# Patient Record
Sex: Male | Born: 1935 | Race: Black or African American | Hispanic: No | Marital: Married | Smoking: Current every day smoker
Health system: Southern US, Community
[De-identification: ages and names within clinical notes are randomized; demographics above are authoritative.]

## PROBLEM LIST (undated history)

## (undated) DIAGNOSIS — N400 Enlarged prostate without lower urinary tract symptoms: Secondary | ICD-10-CM

## (undated) DIAGNOSIS — C679 Malignant neoplasm of bladder, unspecified: Secondary | ICD-10-CM

## (undated) DIAGNOSIS — M109 Gout, unspecified: Secondary | ICD-10-CM

## (undated) DIAGNOSIS — IMO0001 Reserved for inherently not codable concepts without codable children: Secondary | ICD-10-CM

## (undated) DIAGNOSIS — I10 Essential (primary) hypertension: Secondary | ICD-10-CM

## (undated) DIAGNOSIS — J449 Chronic obstructive pulmonary disease, unspecified: Secondary | ICD-10-CM

## (undated) DIAGNOSIS — M1990 Unspecified osteoarthritis, unspecified site: Secondary | ICD-10-CM

## (undated) DIAGNOSIS — I739 Peripheral vascular disease, unspecified: Secondary | ICD-10-CM

## (undated) HISTORY — DX: Essential (primary) hypertension: I10

## (undated) HISTORY — DX: Gout, unspecified: M10.9

## (undated) HISTORY — PX: CATARACT EXTRACTION W/ INTRAOCULAR LENS  IMPLANT, BILATERAL: SHX1307

## (undated) HISTORY — DX: Chronic obstructive pulmonary disease, unspecified: J44.9

---

## 1976-03-21 HISTORY — PX: HAND SURGERY: SHX662

## 2006-03-07 ENCOUNTER — Emergency Department: Payer: Self-pay | Admitting: Emergency Medicine

## 2007-03-27 ENCOUNTER — Ambulatory Visit: Payer: Self-pay | Admitting: Ophthalmology

## 2007-03-27 ENCOUNTER — Other Ambulatory Visit: Payer: Self-pay

## 2007-04-09 ENCOUNTER — Ambulatory Visit: Payer: Self-pay | Admitting: Ophthalmology

## 2010-11-11 ENCOUNTER — Emergency Department: Payer: Self-pay | Admitting: Internal Medicine

## 2012-12-07 ENCOUNTER — Other Ambulatory Visit: Payer: Self-pay | Admitting: Ophthalmology

## 2012-12-11 LAB — WOUND AEROBIC CULTURE

## 2012-12-28 LAB — CULTURE, FUNGUS WITHOUT SMEAR

## 2013-01-09 ENCOUNTER — Ambulatory Visit: Payer: Self-pay | Admitting: Ophthalmology

## 2013-03-18 ENCOUNTER — Inpatient Hospital Stay: Payer: Self-pay | Admitting: Internal Medicine

## 2013-03-18 LAB — RAPID INFLUENZA A&B ANTIGENS

## 2013-03-18 LAB — PRO B NATRIURETIC PEPTIDE: B-Type Natriuretic Peptide: 870 pg/mL — ABNORMAL HIGH (ref 0–450)

## 2013-03-18 LAB — TROPONIN I: Troponin-I: 0.42 ng/mL — ABNORMAL HIGH

## 2013-03-18 LAB — COMPREHENSIVE METABOLIC PANEL
Anion Gap: 11 (ref 7–16)
BUN: 24 mg/dL — ABNORMAL HIGH (ref 7–18)
Bilirubin,Total: 0.9 mg/dL (ref 0.2–1.0)
Calcium, Total: 9.3 mg/dL (ref 8.5–10.1)
Chloride: 98 mmol/L (ref 98–107)
Co2: 21 mmol/L (ref 21–32)
Creatinine: 1.53 mg/dL — ABNORMAL HIGH (ref 0.60–1.30)
Glucose: 152 mg/dL — ABNORMAL HIGH (ref 65–99)
Osmolality: 268 (ref 275–301)
SGOT(AST): 48 U/L — ABNORMAL HIGH (ref 15–37)
Total Protein: 9.8 g/dL — ABNORMAL HIGH (ref 6.4–8.2)

## 2013-03-18 LAB — CBC WITH DIFFERENTIAL/PLATELET
Basophil #: 0 10*3/uL (ref 0.0–0.1)
Eosinophil #: 0 10*3/uL (ref 0.0–0.7)
Eosinophil %: 0.1 %
HGB: 11.3 g/dL — ABNORMAL LOW (ref 13.0–18.0)
Lymphocyte %: 13.1 %
MCH: 29.3 pg (ref 26.0–34.0)
MCHC: 33.8 g/dL (ref 32.0–36.0)
MCV: 87 fL (ref 80–100)
Monocyte #: 0.3 x10 3/mm (ref 0.2–1.0)
Neutrophil %: 80.6 %
Platelet: 250 10*3/uL (ref 150–440)
RBC: 3.84 10*6/uL — ABNORMAL LOW (ref 4.40–5.90)
RDW: 18.5 % — ABNORMAL HIGH (ref 11.5–14.5)
WBC: 5.6 10*3/uL (ref 3.8–10.6)

## 2013-03-18 LAB — PROTIME-INR: INR: 1.1

## 2013-03-19 LAB — URINALYSIS, COMPLETE
Bacteria: NONE SEEN
Nitrite: NEGATIVE
RBC,UR: 1 /HPF (ref 0–5)
Squamous Epithelial: NONE SEEN
WBC UR: 1 /HPF (ref 0–5)

## 2013-03-19 LAB — BASIC METABOLIC PANEL
Chloride: 101 mmol/L (ref 98–107)
Co2: 22 mmol/L (ref 21–32)
Creatinine: 1.3 mg/dL (ref 0.60–1.30)
EGFR (African American): >60
EGFR (Non-African Amer.): 52 — ABNORMAL LOW
Osmolality: 286 (ref 275–301)
Sodium: 136 mmol/L (ref 136–145)

## 2013-03-19 LAB — MAGNESIUM: Magnesium: 2.2 mg/dL

## 2013-03-19 LAB — CK-MB: CK-MB: 6.3 ng/mL — ABNORMAL HIGH (ref 0.5–3.6)

## 2013-03-20 ENCOUNTER — Ambulatory Visit: Payer: Self-pay | Admitting: Hematology and Oncology

## 2013-03-23 LAB — CULTURE, BLOOD (SINGLE)

## 2013-04-29 ENCOUNTER — Ambulatory Visit: Payer: Self-pay | Admitting: Hematology and Oncology

## 2013-05-13 ENCOUNTER — Ambulatory Visit: Payer: Self-pay | Admitting: Hematology and Oncology

## 2013-05-19 ENCOUNTER — Ambulatory Visit: Payer: Self-pay | Admitting: Hematology and Oncology

## 2013-07-25 DIAGNOSIS — I10 Essential (primary) hypertension: Secondary | ICD-10-CM | POA: Insufficient documentation

## 2013-07-25 DIAGNOSIS — M109 Gout, unspecified: Secondary | ICD-10-CM | POA: Insufficient documentation

## 2013-07-25 DIAGNOSIS — R918 Other nonspecific abnormal finding of lung field: Secondary | ICD-10-CM | POA: Insufficient documentation

## 2013-07-25 DIAGNOSIS — J449 Chronic obstructive pulmonary disease, unspecified: Secondary | ICD-10-CM | POA: Insufficient documentation

## 2013-07-25 DIAGNOSIS — H2010 Chronic iridocyclitis, unspecified eye: Secondary | ICD-10-CM | POA: Insufficient documentation

## 2013-07-25 DIAGNOSIS — F17210 Nicotine dependence, cigarettes, uncomplicated: Secondary | ICD-10-CM | POA: Insufficient documentation

## 2013-07-29 ENCOUNTER — Other Ambulatory Visit: Payer: Self-pay | Admitting: Rheumatology

## 2013-07-29 LAB — SYNOVIAL CELL COUNT + DIFF, W/ CRYSTALS
BASOS ABS: 0 %
Eosinophil: 0 %
Lymphocytes: 1 %
Neutrophils: 89 %
Nucleated Cell Count: 10653 /mm3
OTHER CELLS BF: 0 %
Other Mononuclear Cells: 10 %

## 2013-09-13 ENCOUNTER — Ambulatory Visit: Payer: Self-pay | Admitting: Hematology and Oncology

## 2013-09-13 LAB — COMPREHENSIVE METABOLIC PANEL
Albumin: 3.1 g/dL — ABNORMAL LOW (ref 3.4–5.0)
Alkaline Phosphatase: 113 U/L
Anion Gap: 9 (ref 7–16)
BUN: 11 mg/dL (ref 7–18)
Bilirubin,Total: 0.4 mg/dL (ref 0.2–1.0)
Calcium, Total: 9.7 mg/dL (ref 8.5–10.1)
Chloride: 101 mmol/L (ref 98–107)
Co2: 29 mmol/L (ref 21–32)
Creatinine: 0.86 mg/dL (ref 0.60–1.30)
EGFR (African American): >60
EGFR (Non-African Amer.): >60
GLUCOSE: 93 mg/dL (ref 65–99)
Osmolality: 277 (ref 275–301)
Potassium: 3.9 mmol/L (ref 3.5–5.1)
SGOT(AST): 11 U/L — ABNORMAL LOW (ref 15–37)
SGPT (ALT): 11 U/L — ABNORMAL LOW (ref 12–78)
Sodium: 139 mmol/L (ref 136–145)
Total Protein: 9.2 g/dL — ABNORMAL HIGH (ref 6.4–8.2)

## 2013-09-13 LAB — CBC CANCER CENTER
BASOS ABS: 0 x10 3/mm (ref 0.0–0.1)
BASOS PCT: 0.1 %
EOS ABS: 0 x10 3/mm (ref 0.0–0.7)
EOS PCT: 0.6 %
HCT: 35.8 % — ABNORMAL LOW (ref 40.0–52.0)
HGB: 11.7 g/dL — ABNORMAL LOW (ref 13.0–18.0)
LYMPHS PCT: 30.3 %
Lymphocyte #: 2 x10 3/mm (ref 1.0–3.6)
MCH: 29.9 pg (ref 26.0–34.0)
MCHC: 32.7 g/dL (ref 32.0–36.0)
MCV: 92 fL (ref 80–100)
Monocyte #: 0.3 x10 3/mm (ref 0.2–1.0)
Monocyte %: 5.1 %
Neutrophil #: 4.2 x10 3/mm (ref 1.4–6.5)
Neutrophil %: 63.9 %
PLATELETS: 348 x10 3/mm (ref 150–440)
RBC: 3.91 10*6/uL — AB (ref 4.40–5.90)
RDW: 21.4 % — ABNORMAL HIGH (ref 11.5–14.5)
WBC: 6.5 x10 3/mm (ref 3.8–10.6)

## 2013-09-18 ENCOUNTER — Ambulatory Visit: Payer: Self-pay | Admitting: Hematology and Oncology

## 2014-01-09 ENCOUNTER — Ambulatory Visit: Payer: Self-pay | Admitting: Rheumatology

## 2014-01-09 LAB — BODY FLUID CELL COUNT WITH DIFFERENTIAL
BASOS ABS: 0 %
EOS PCT: 0 %
LYMPHS PCT: 4 %
NUCLEATED CELL COUNT: 1954 /mm3
Neutrophils: 80 %
Other Cells BF: 0 %
Other Mononuclear Cells: 16 %

## 2014-01-09 LAB — SYNOVIAL FLUID, CRYSTAL

## 2014-03-18 ENCOUNTER — Ambulatory Visit: Payer: Self-pay | Admitting: Vascular Surgery

## 2014-03-31 ENCOUNTER — Ambulatory Visit: Payer: Self-pay | Admitting: Hematology and Oncology

## 2014-03-31 LAB — COMPREHENSIVE METABOLIC PANEL
ALK PHOS: 107 U/L
ALT: 9 U/L — AB
AST: 12 U/L — AB (ref 15–37)
Albumin: 3.1 g/dL — ABNORMAL LOW (ref 3.4–5.0)
Anion Gap: 7 (ref 7–16)
BUN: 14 mg/dL (ref 7–18)
Bilirubin,Total: 0.4 mg/dL (ref 0.2–1.0)
CHLORIDE: 102 mmol/L (ref 98–107)
Calcium, Total: 9.5 mg/dL (ref 8.5–10.1)
Co2: 30 mmol/L (ref 21–32)
Creatinine: 1.05 mg/dL (ref 0.60–1.30)
EGFR (African American): >60
Glucose: 98 mg/dL (ref 65–99)
Osmolality: 278 (ref 275–301)
Potassium: 4.1 mmol/L (ref 3.5–5.1)
Sodium: 139 mmol/L (ref 136–145)
TOTAL PROTEIN: 8.8 g/dL — AB (ref 6.4–8.2)

## 2014-03-31 LAB — CBC WITH DIFFERENTIAL/PLATELET
Basophil #: 0 10*3/uL (ref 0.0–0.1)
Basophil %: 0.3 %
Eosinophil #: 0.1 10*3/uL (ref 0.0–0.7)
Eosinophil %: 1.1 %
HCT: 37.1 % — AB (ref 40.0–52.0)
HGB: 12 g/dL — ABNORMAL LOW (ref 13.0–18.0)
LYMPHS PCT: 31.4 %
Lymphocyte #: 1.6 10*3/uL (ref 1.0–3.6)
MCH: 30.4 pg (ref 26.0–34.0)
MCHC: 32.4 g/dL (ref 32.0–36.0)
MCV: 94 fL (ref 80–100)
Monocyte #: 0.3 x10 3/mm (ref 0.2–1.0)
Monocyte %: 6.7 %
NEUTROS PCT: 60.5 %
Neutrophil #: 3.1 10*3/uL (ref 1.4–6.5)
Platelet: 282 10*3/uL (ref 150–440)
RBC: 3.97 10*6/uL — ABNORMAL LOW (ref 4.40–5.90)
RDW: 18.9 % — AB (ref 11.5–14.5)
WBC: 5.2 10*3/uL (ref 3.8–10.6)

## 2014-03-31 LAB — URIC ACID: Uric Acid: 4.1 mg/dL (ref 3.5–7.2)

## 2014-04-09 DIAGNOSIS — Z8679 Personal history of other diseases of the circulatory system: Secondary | ICD-10-CM | POA: Insufficient documentation

## 2014-04-21 ENCOUNTER — Ambulatory Visit: Payer: Self-pay | Admitting: Hematology and Oncology

## 2014-07-12 NOTE — Discharge Summary (Signed)
PATIENT NAME:  Nathaniel Armstrong, Nathaniel Armstrong MR#:  [id number] DATE OF BIRTH:  January 17, 1933  DATE OF ADMISSION:  03/18/2013 DATE OF DISCHARGE:  03/20/2013  ADMISSION DIAGNOSIS: Bilateral community-acquired pneumonia.   DISCHARGE DIAGNOSES: 1.  Bilateral community-acquired pneumonia.  2.  Acute renal failure.  3.  Elevated troponin from demand ischemia.  4.  History of hypertension.  5.  Hyponatremia.  6.  Hypomagnesemia.   7.  Abnormal CT scan.  8.  Tobacco abuse.   CONSULTATIONS: None.   LABORATORIES AT DISCHARGE: Urine culture mixed bacterial contamination. Blood culture negative to date. Troponin at discharge 0.38, troponin max 0.43. CT angiogram was negative for PE; however, it does show ill-defined bilateral lower lobe airspace opacities, right greater than left, worrisome for multifocal infection and indeterminate approximately 4 mm noncalcified subpleural nodule within the left upper lobe.   HOSPITAL COURSE:  This is a 81 year old male who presented with shortness of breath, was found to have bilateral pneumonia. For further details, please refer to the H and P.  1.  Bilateral community-acquired pneumonia. The patient was started on Levaquin. He also was started on oxygen as well as steroids. His blood cultures and influenza were negative. However, his wife is also sick in the hospital with possible influenza so Tamiflu was also started. He had some mild very acute COPD exacerbation secondary to his community-acquired pneumonia. He does need oxygen at discharge for his chronic respiratory failure with COPD and this new pneumonia. His CT scan on admission did not show pulmonary emboli. It was concerning for a pulmonary nodule. He has history of smoking and weight loss. I have recommended that the patient see oncology and we have scheduled an appointment with oncology as an outpatient prior to discharge. I have asked the unit clerk to schedule this.  I have also talked to the family in great detail about this  abnormal CT scan and close monitoring.  2.  Acute renal failure, resolved.  3.  Elevated troponin from demand ischemia. No evidence of acute coronary syndrome. This is secondary to supply/demand ischemia.  4.  History of hypertension. The patient's blood pressure was low-normal so we held his blood pressure medications.  5.  Hyponatremia, improved with IV fluids.  6.  Hypomagnesemia which was repleted.  7.  Abnormal CT scan with tobacco abuse. The patient was counseled to stop smoking and also as dictated on his abnormal CT he will need oncology followup.   DISCHARGE MEDICATIONS: 1.  Colcrys 0.6 mg daily.  2.  Vitamin D 2000 international units daily.  3.  Levaquin 750 mg x 7 days daily.  4.  Tamiflu 75 mg q.12h. x 4 days.  5.  Prednisone taper starting at 50 mg, taper by 10 mg every 2 days.  6.  Advair Diskus 100/50 b.i.d.  6.  The patient's Norvasc is being held due to low-normal blood pressure.   The patient is being discharged with home health.   DISCHARGE OXYGEN: 2 liters nasal cannula.   DISCHARGE FOLLOWUP: One week with [facility] and 1 week with oncology.   TIME SPENT: 35 minutes.   The patient is medically stable for discharge.   ____________________________ Elga Santy P. Benjie Karvonen, MD spm:cs D: 03/20/2013 15:58:08 ET T: 03/20/2013 19:28:56 ET JOB#: [id number]  cc: Monifah Freehling P. Benjie Karvonen, MD, <Dictator> [facility] oncology Mirna Sutcliffe P Davone Shinault MD ELECTRONICALLY SIGNED 03/21/2013 13:55

## 2014-07-12 NOTE — H&P (Signed)
PATIENT NAME:  Nathaniel Armstrong, Nathaniel Armstrong MR#:  [id number] DATE OF BIRTH:  February 18, 1933  DATE OF ADMISSION:  03/18/2013  REFERRING PHYSICIAN:  Dr. Delman Kitten   PRIMARY CARE PHYSICIAN: [facility].   CHIEF COMPLAINT: Shortness of breath, cough.   HISTORY OF PRESENT ILLNESS: This is a 81 year old male with past medical history of hypertension, gout, presents with complaints of shortness of breath and cough. For the last few days, reports his symptoms started with mild shortness of breath, mainly upon exertion seven days ago.  Then he reports that he developed cough with green productive sputum, having some chills at home, he denies any recent travel, any leg swelling. The patient was hypoxic 75% on room air. The patient reports he smokes 1 pack per day. The patient had CT chest with IV contrast out of concern of PE. The patient's CT chest came back negative for PE but did show ill-defined bilateral lower airspace opacities as well as severe emphysema and indeterminate approximately 4 mm noncalcified subpleural nodule. The patient was initially tachycardic upon presentation with a heart rate in the 130s. The patient has multiple lab abnormalities including acute renal failure with a creatinine 1.53, hypokalemia and hyponatremia and hypomagnesemia  as well. The patient's troponin came back elevated at 0.42. The patient denies any chest pain. He received 324 of aspirin in the ED.  His EKG did not show any significant ST or T wave abnormalities. As well, the patient denies any previous diagnosis of chronic obstructive pulmonary disease. Hospitalist service was requested to admit the patient for further management and treatment of his pneumonia and acute respiratory failure.   PAST MEDICAL HISTORY: 1.  Tobacco abuse.  2.  Hypertension.  3.  Gout.   PAST SURGICAL HISTORY:  Cataract surgery.   ALLERGIES: PENICILLIN.   FAMILY HISTORY: Significant for hypertension.   SOCIAL HISTORY: The patient lives at home, smokes 1  pack per day. No alcohol. No illicit drug use.   HOME MEDICATIONS: 1.  Norvasc 10 mg oral daily.  2.  Colchicine 0.6 mg oral daily.  3.  Vitamin D3 1000 international units oral daily.   REVIEW OF SYSTEMS: CONSTITUTIONAL: The patient complains of chills. Denies fever. Complains of fatigue and weakness. Denies weight gain, weight loss.  EYES: Denies blurry vision, double vision, inflammation, glaucoma.  ENT: Denies tinnitus, ear pain, hearing loss, epistaxis.  RESPIRATORY: Complains of cough and shortness of breath. Denies any wheezing, hemoptysis or chronic obstructive pulmonary disease.  CARDIOVASCULAR: Denies any chest pain, edema, arrhythmia, palpitations, syncope.  GASTROINTESTINAL: Denies nausea, vomiting, diarrhea, abdominal pain.  GENITOURINARY: Denies dysuria, hematuria, renal colic.  ENDOCRINE: Denies polyuria, polydipsia, heat or cold intolerance.  HEMATOLOGY: Denies anemia, easy bruising, bleeding diathesis.  INTEGUMENTARY: Denies acne, rash or skin lesion.  MUSCULOSKELETAL: Denies any swelling, arthritis or cramps. Reports history of gout.  NEUROLOGIC: Denies CVA, transient ischemic attack, headache, ataxia, vertigo.  PSYCHIATRIC: Denies anxiety, insomnia, bipolar disorder.   PHYSICAL EXAMINATION: VITAL SIGNS: Temperature 98.1, pulse 130, respiratory rate 26, blood pressure 151/74, saturating 74% on room air.  GENERAL: Elderly male lies comfortable in bed, in no apparent distress.  HEENT: Head is atraumatic, normocephalic. Pupils equal, reactive to light. Pink conjunctivae. Anicteric sclerae. Dry oral mucosa.  NECK: Supple. No thyromegaly. No JVD.  CHEST: Had fair air entry bilaterally. No use of accessory muscle. No wheezing, rales or rhonchi.  CARDIOVASCULAR: S1, S2 heard. No rubs, murmurs or gallops. Tachycardic.  ABDOMEN: Soft, nontender, nondistended. Bowel sounds present.  EXTREMITIES: No edema. No  clubbing. No cyanosis.  PSYCHIATRIC: Appropriate affect. Awake, alert  x 3. Normal judgement and insight.   NEUROLOGIC: Cranial nerves grossly intact. Motor strength 5/5. No focal deficits.  LYMPHATIC: No cervical lymphadenopathy.  SKIN: Warm and dry, normal skin turgor.   PERTINENT LABORATORY, DIAGNOSTIC AND RADIOLOGIC DATA: Glucose 152. BNP 870, BUN 24, creatinine 1.53, sodium 130, potassium 3.4, chloride 98, CO2 21, magnesium 1.5, ALT 20, AST 48, alkaline phosphatase 94. Troponin 0.42, CK-MB 7.5. White blood cell 5.6, hemoglobin 11.3, hematocrit 33.4, platelet 250. On VBG pH 7.32, pCO2 of 43, lactic acid of f4.   CT angiography chest with IV contrast showing negative for PE, ill-defined bilateral lower lobe air space opacities and severe emphysema and indeterminate 4 mm  noncalcified nodule.   ASSESSMENT AND PLAN: 1.  Acute hypoxic respiratory failure, this is most likely related to his pneumonia, as well, the patient appears to be having chronic obstructive pulmonary disease as well but does not appear to be in exacerbation. We will keep him on oxygen as needed to keep his oxygen saturation 93%. The patient does not appear to be needing BiPAP currently.  2.  Pneumonia. We will start the patient on levofloxacin for treatment of community-acquired pneumonia. We will follow on the blood cultures.  3.  Acute renal failure. This is most likely due to dehydration, we will have him on IV fluids.  4.  Hypokalemia, hyponatremia and hypomagnesemia. We will replace. We will recheck in the morning. This is most likely related to dehydration.  5.  Elevated troponin. The patient denies any chest pain, has no ST or T wave changes, does not have any cardiac history. This is most likely related to demand ischemia from his hypoxia and pneumonia. He was given 324 mg of aspirin in the Emergency Department. We will monitor on telemetry. We will continue to cycle his cardiac enzymes and follow the trend.  6.  Tobacco abuse. The patient was counseled at length for five minutes with family  present, who encouraged him as well to stop smoking, but currently, he does not want to stop smoking as well does not want any nicotine patches.  7.  Hypertension. Mildly elevated. Continue with Norvasc.  8.  Gout. Continue with colchicine.  9.  Chronic obstructive pulmonary disease. He does not appear to be in exacerbation. Continue with nebs as needed.  10.  Pulmonary nodule. Discussed with the family and the patient. The patient was instructed to follow with his primary care physician about that and he is to repeat his CT chest and six months to one year.  11.  Deep vein thrombosis prophylaxis. Subcutaneous heparin.   CODE STATUS: Discussed with the patient is wishing to be full code.   TOTAL TIME SPENT ON ADMISSION AND PATIENT CARE: 50 minutes    ____________________________ Albertine Patricia, MD dse:cc D: 03/18/2013 22:34:36 ET T: 03/18/2013 23:56:03 ET JOB#: [id number]  cc: Albertine Patricia, MD, <Dictator> [facility] Graciela Husbands MD ELECTRONICALLY SIGNED 03/22/2013 2:36

## 2014-10-02 ENCOUNTER — Inpatient Hospital Stay: Payer: Medicare Other | Attending: Hematology and Oncology | Admitting: Hematology and Oncology

## 2014-10-02 ENCOUNTER — Ambulatory Visit
Admission: RE | Admit: 2014-10-02 | Discharge: 2014-10-02 | Disposition: A | Discharge status: Home / Self Care | Payer: Medicare Other | Source: Ambulatory Visit | Attending: Hematology and Oncology | Admitting: Hematology and Oncology

## 2014-10-02 ENCOUNTER — Encounter: Payer: Self-pay | Admitting: Hematology and Oncology

## 2014-10-02 ENCOUNTER — Other Ambulatory Visit: Payer: Self-pay | Admitting: Hematology and Oncology

## 2014-10-02 VITALS — BP 145/74 | HR 79 | Temp 97.6°F | Resp 18 | Ht 67.0 in | Wt 118.5 lb

## 2014-10-02 DIAGNOSIS — I10 Essential (primary) hypertension: Secondary | ICD-10-CM | POA: Diagnosis not present

## 2014-10-02 DIAGNOSIS — R0602 Shortness of breath: Secondary | ICD-10-CM

## 2014-10-02 DIAGNOSIS — J449 Chronic obstructive pulmonary disease, unspecified: Secondary | ICD-10-CM | POA: Diagnosis not present

## 2014-10-02 DIAGNOSIS — I739 Peripheral vascular disease, unspecified: Secondary | ICD-10-CM | POA: Insufficient documentation

## 2014-10-02 DIAGNOSIS — Z79899 Other long term (current) drug therapy: Secondary | ICD-10-CM | POA: Diagnosis not present

## 2014-10-02 DIAGNOSIS — J439 Emphysema, unspecified: Secondary | ICD-10-CM | POA: Insufficient documentation

## 2014-10-02 DIAGNOSIS — I2582 Chronic total occlusion of coronary artery: Secondary | ICD-10-CM | POA: Diagnosis not present

## 2014-10-02 DIAGNOSIS — R918 Other nonspecific abnormal finding of lung field: Secondary | ICD-10-CM | POA: Diagnosis not present

## 2014-10-02 DIAGNOSIS — Z8701 Personal history of pneumonia (recurrent): Secondary | ICD-10-CM | POA: Diagnosis not present

## 2014-10-02 DIAGNOSIS — F17210 Nicotine dependence, cigarettes, uncomplicated: Secondary | ICD-10-CM

## 2014-10-02 DIAGNOSIS — I2510 Atherosclerotic heart disease of native coronary artery without angina pectoris: Secondary | ICD-10-CM | POA: Insufficient documentation

## 2014-10-02 NOTE — Progress Notes (Signed)
[facility]-  Cancer Center  Clinic day:  10/02/2014  Chief Complaint: Nathaniel Armstrong is a 82 y.o. male with pulmonary nodule who is seen for reassessment.  HPI: The patient has an extensive smoking history.  He started smoking at age 8.  He has smoked 1 pack a day.  Recently, he has been smoking 1/2 pack per day.   He was admitted to the hospital in 02/2013 with bilobar pneumonia.  Chest CT scan revealed pneumonia and a RUL nodule.  Follow-up chest CT in 04/29/2013 revealed resolution of the infiltrate but a persistent nodule.  Imaging noted few subtle bilateral subpleural nodular opacities.  Chest CT on 09/13/2013 revealed advanced emphysema with stable RUL and RLL scarring or atelectasis.  He had no new nodules. He was to have a follow-up scan after his last visit, but insurance precluded obtaining the scan.  The patient was last seen in the medical oncology clinic on 04/03/2014.  At that time, he was seen by Dr. Burke.  He had some slight weight loss.  He continued to smoke 1/2 pack per day.  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  There was occlusion of the right common and external iliac arteries.  He was advised to have surgery.  He declined.  He did not have a follow-up chest CT (ordered).  During the interim, the patient has done well.  He denies any weight loss.  He denies any change in cough or hemoptysis.  He denies any bone pain.  He continues to smoke 1/2 pack per day.  Chest x-ray on 10/02/2014 revealed emphysema and chronic interstitial changes.     Past Medical History  Diagnosis Date  . Hypertension   . COPD (chronic obstructive pulmonary disease)     No past surgical history on file.  Family History  Problem Relation Age of Onset  . Hypertension Mother   . Hypertension Father   .  Stroke Sister   . Hypertension Sister     Social History:  reports that he has been smoking Cigarettes.  He has a 36 pack-year smoking history. He has never used smokeless tobacco. He reports that he does not drink alcohol or use illicit drugs.  He started smoking at age 8.  He previously smoked 1 pack a day.  He currently smokes 1/2 pack a day.  The worked as a brick mason.  He is accompanied by  his daughter, [name], today.  Allergies:  Allergies  Allergen Reactions  . Penicillins Other (See Comments)    Current Medications: Current Outpatient Prescriptions  Medication Sig Dispense Refill  . allopurinol (ZYLOPRIM) 100 MG tablet     . amLODipine (NORVASC) 10 MG tablet Take by mouth.    . Cholecalciferol (VITAMIN D3) 1000 UNITS CAPS Take by mouth.    . colchicine 0.6 MG tablet Take by mouth.    . Fluticasone-Salmeterol (ADVAIR DISKUS) 100-50 MCG/DOSE AEPB     . losartan (COZAAR) 50 MG tablet     . Tobramycin-Dexamethasone 0.3-0.05 % SUSP 1 drop.     No current facility-administered medications for this visit.    Review of Systems:  GENERAL:  Feels good.  Active.  No fevers, sweats or weight loss. PERFORMANCE STATUS (ECOG): 1 HEENT:  No visual changes, runny nose, sore throat, mouth sores or tenderness. Lungs: Breathes "fine".  Sometimes coughs up phlegm.  No hemoptysis.   Cardiac:  No chest pain, palpitations, orthopnea, or PND. GI:  No nausea, vomiting, diarrhea, constipation, melena or hematochezia.  Recent guaiac cards done.  Says he won't have a colonoscopy. GU:  No urgency, frequency, dysuria, or hematuria. Musculoskeletal:  No back pain. Knee stiff and sore.  No muscle tenderness. Extremities:  No pain or swelling. Skin:  No rashes or skin changes. Neuro:  No headache, numbness or weakness, balance or coordination issues. Endocrine:  No diabetes, thyroid issues, hot flashes or night sweats. Psych:  No mood changes, depression or anxiety. Pain:  No focal pain. Review of  systems:  All other systems reviewed and found to be negative.   Physical Exam: Blood pressure 145/74, pulse 79, temperature 97.6 F (36.4 C), temperature source Oral, resp. rate 18, height 5' 7" (1.702 m), weight 118 lb 8 oz (53.75 kg). GENERAL:  Thin elderly gentleman sitting comfortably in the exam room in no acute distress.  Smells of smoke. MENTAL STATUS:  Alert and oriented to person, place and time. HEAD:  Wearing a khaki cap.  Brown hair with graying.  Male pattern baldness.  Normocephalic, atraumatic, face symmetric, no Cushingoid features. EYES:  Brown eyes.  Pupils equal round and reactive to light and accomodation.  No conjunctivitis or scleral icterus. ENT:  Oropharynx clear without lesion.  Missing several teeth.  Tongue normal. Mucous membranes moist.  RESPIRATORY:  Clear to auscultation without rales, wheezes or rhonchi. CARDIOVASCULAR:  Regular rate and rhythm without murmur, rub or gallop. ABDOMEN:  Soft, non-tender, with active bowel sounds, and no hepatosplenomegaly.  No masses. BACK:  No CVA tenderness.  No tenderness on percussion of the back or rib cage. SKIN:  Multiple dark black areas across abdomen from prior trauma with bricks.  No rashes, ulcers or lesions. EXTREMITIES: Right MCP abnormality s/p prior trauma (nearly transected).  Right hand nicotine stained nails.  No edema or tenderness.  No palpable cords. LYMPH NODES: No palpable cervical, supraclavicular, axillary or inguinal adenopathy  NEUROLOGICAL: Unremarkable. PSYCH:  Appropriate.   No visits with results within 3 Day(s) from this visit. Latest known visit with results is:  [facility] Conversion on 03/31/2014  Component Date Value Ref Range Status  . WBC 03/31/2014 5.2  3.8-10.6 x10 3/mm 3 Final  . RBC 03/31/2014 3.97* 4.40-5.90 x10 6/mm 3 Final  . HGB 03/31/2014 12.0* 13.0-18.0 g/dL Final  . HCT 03/31/2014 37.1* 40.0-52.0 % Final  . MCV 03/31/2014 94  80-100 fL Final  . MCH 03/31/2014 30.4  26.0-34.0 pg  Final  . MCHC 03/31/2014 32.4  32.0-36.0 g/dL Final  . RDW 03/31/2014 18.9* 11.5-14.5 % Final  . Platelet 03/31/2014 282  150-440 x10 3/mm 3 Final  . Neutrophil % 03/31/2014 60.5   Final  . Lymphocyte % 03/31/2014 31.4   Final  . Monocyte % 03/31/2014 6.7   Final  . Eosinophil % 03/31/2014 1.1   Final  . Basophil % 03/31/2014 0.3   Final  . Neutrophil # 03/31/2014 3.1  1.4-6.5 x10 3/mm 3 Final  . Lymphocyte # 03/31/2014 1.6  1.0-3.6 x10 3/mm 3 Final  . Monocyte # 03/31/2014 0.3  0.2-1.0 x10 3/mm  Final  . Eosinophil # 03/31/2014 0.1  0.0-0.7 x10 3/mm 3 Final  . Basophil # 03/31/2014 0.0  0.0-0.1 x10 3/mm 3 Final  . Glucose 03/31/2014 98  65-99 mg/dL Final  . BUN 03/31/2014 14  7-18 mg/dL Final  . Creatinine 03/31/2014 1.05  0.60-1.30 mg/dL Final  . Sodium 03/31/2014 139  136-145 mmol/L  Final  . Potassium 03/31/2014 4.1  3.5-5.1 mmol/L Final  . Chloride 03/31/2014 102  98-107 mmol/L Final  . Co2 03/31/2014 30  21-32 mmol/L Final  . Calcium, Total 03/31/2014 9.5  8.5-10.1 mg/dL Final  . SGOT(AST) 03/31/2014 12* 15-37 Unit/L Final  . SGPT (ALT) 03/31/2014 9*  Final   Comment: 14-63 NOTE: New Reference Range 10/08/13   . Alkaline Phosphatase 03/31/2014 107   Final   Comment: 46-116 NOTE: New Reference Range 10/08/13   . Albumin 03/31/2014 3.1* 3.4-5.0 g/dL Final  . Total Protein 03/31/2014 8.8* 6.4-8.2 g/dL Final  . Bilirubin,Total 03/31/2014 0.4  0.2-1.0 mg/dL Final  . Osmolality 03/31/2014 278  275-301 Final  . Anion Gap 03/31/2014 7  7-16 Final  . EGFR (African American) 03/31/2014 >60  >60mL/min Final  . EGFR (Non-African Amer.) 03/31/2014 >60  >60mL/min Final   Comment: eGFR values <60mL/min/1.73 m2 may be an indication of chronic kidney disease (CKD). Calculated eGFR, using the MRDR Study equation, is useful in  patients with stable renal function. The eGFR calculation will not be reliable in acutely ill patients when serum creatinine is changing rapidly. It is not  useful in patients on dialysis. The eGFR calculation may not be applicable to patients at the low and high extremes of body sizes, pregnant women, and vegetarians.   . Uric Acid 03/31/2014 4.1  3.5-7.2 mg/dL Final    Assessment:  Dezmen Colford is a 82 y.o. male with a history of pulmonary nodule first detected on chest CT in 02/2013 when he was admitted with bilateral pneumonia.  Follow-up imaging revealed resolution of pneumonia and persistent lung nodule.  He continues to smoke 1/2 pack per day.  Chest x-ray on 10/02/2014 revealed emphysema and chronic interstitial changes.    Symptomatically, he has a rare productive cough.  Weight is stable.  Exam is unremarkable.  Plan: 1. Review entire medical history, diagnosis of pneumonia and lung nodule and need for follow-up imaging. 2. Discuss severe peripheral vascular disease-  Patient declines intervention. 3. Discuss need for screening colonoscopy- patient declines. 4. Discuss smoking cessation. 5. Schedule follow-up non-contrasted chest CT. 6. Note for daughter's work. 7. RTC after chest CT.   Melissa C Corcoran, MD  10/02/2014, 11:31 AM  

## 2014-10-08 ENCOUNTER — Ambulatory Visit
Admission: RE | Admit: 2014-10-08 | Discharge: 2014-10-08 | Disposition: A | Discharge status: Home / Self Care | Payer: Medicare Other | Source: Ambulatory Visit | Attending: Hematology and Oncology | Admitting: Hematology and Oncology

## 2014-10-08 DIAGNOSIS — R918 Other nonspecific abnormal finding of lung field: Secondary | ICD-10-CM | POA: Diagnosis present

## 2014-10-08 DIAGNOSIS — J439 Emphysema, unspecified: Secondary | ICD-10-CM | POA: Insufficient documentation

## 2014-10-08 DIAGNOSIS — F17200 Nicotine dependence, unspecified, uncomplicated: Secondary | ICD-10-CM | POA: Insufficient documentation

## 2014-10-08 DIAGNOSIS — I2510 Atherosclerotic heart disease of native coronary artery without angina pectoris: Secondary | ICD-10-CM | POA: Insufficient documentation

## 2014-10-13 ENCOUNTER — Encounter: Payer: Self-pay | Admitting: Hematology and Oncology

## 2014-10-13 ENCOUNTER — Inpatient Hospital Stay (HOSPITAL_BASED_OUTPATIENT_CLINIC_OR_DEPARTMENT_OTHER): Payer: Medicare Other | Admitting: Hematology and Oncology

## 2014-10-13 VITALS — BP 144/67 | HR 69 | Temp 97.0°F | Ht 67.0 in | Wt 118.6 lb

## 2014-10-13 DIAGNOSIS — Z79899 Other long term (current) drug therapy: Secondary | ICD-10-CM

## 2014-10-13 DIAGNOSIS — J449 Chronic obstructive pulmonary disease, unspecified: Secondary | ICD-10-CM | POA: Diagnosis not present

## 2014-10-13 DIAGNOSIS — F17210 Nicotine dependence, cigarettes, uncomplicated: Secondary | ICD-10-CM

## 2014-10-13 DIAGNOSIS — I2510 Atherosclerotic heart disease of native coronary artery without angina pectoris: Secondary | ICD-10-CM

## 2014-10-13 DIAGNOSIS — R918 Other nonspecific abnormal finding of lung field: Secondary | ICD-10-CM

## 2014-10-13 DIAGNOSIS — I10 Essential (primary) hypertension: Secondary | ICD-10-CM

## 2014-10-13 DIAGNOSIS — Z8701 Personal history of pneumonia (recurrent): Secondary | ICD-10-CM | POA: Diagnosis not present

## 2014-10-13 DIAGNOSIS — I2582 Chronic total occlusion of coronary artery: Secondary | ICD-10-CM

## 2014-10-13 DIAGNOSIS — I739 Peripheral vascular disease, unspecified: Secondary | ICD-10-CM

## 2014-10-13 NOTE — Progress Notes (Signed)
[facility] day:  10/13/2014  Chief Complaint: Nathaniel Armstrong is a 80 y.o. male with a pulmonary nodule who is seen for review of interval CT scan and discussion regarding direction of therapy.  HPI: The patient was last seen in the medical oncology clinic on 10/02/2014.  At that time, he was seen for initial assessment by me.  He had a history of pulmonary nodule first detected on chest CT in 02/2013 when he was admitted with bilateral pneumonia. Follow-up imaging revealed resolution of pneumonia and persistent lung nodule. He was smoking 1/2 pack per day. Chest x-ray on 10/02/2014 revealed emphysema and chronic interstitial changes.  Symptomatically, he had a rare productive cough. Weight was stable. Exam was unremarkable.  At last visit, we discussed his severe peripheral vascular disease.  He declined intervention.  We discussed screening colonoscopy.  He declined. i discussed smoking cessation.  Plans were made for follow-up chest CT.  Chest CT on 10/08/2014 revealed central lobar and paraseptal emphysema with scattered pulmonary parenchymal scarring. There were no pathologically enlarged mediastinal or axillary lymph nodes. He has known coronary artery calcification.  Symptomatically, he denies any concerns.  He notes a stress test in early 2016 was "fine".   Past Medical History  Diagnosis Date  . Hypertension   . COPD (chronic obstructive pulmonary disease)   . Gout     Past Surgical History  Procedure Laterality Date  . Hand surgery  1978    Family History  Problem Relation Age of Onset  . Hypertension Mother   . Hypertension Father   . Stroke Sister   . Hypertension Sister     Social History:  reports that he has been smoking Cigarettes.  He has a 36 pack-year smoking history. He has never used smokeless tobacco. He reports that he does not drink alcohol or use illicit drugs.  He started smoking at age 23.  He previously  smoked 1 pack a day.  He currently smokes 1/2 pack a day.  The worked as a Horticulturist, commercial.  He is accompanied by  his wife, Nathaniel Armstrong, today.  Allergies:  Allergies  Allergen Reactions  . Penicillins Other (See Comments)    Current Medications: Current Outpatient Prescriptions  Medication Sig Dispense Refill  . allopurinol (ZYLOPRIM) 100 MG tablet     . amLODipine (NORVASC) 10 MG tablet Take by mouth.    . Cholecalciferol (VITAMIN D3) 1000 UNITS CAPS Take by mouth.    . colchicine 0.6 MG tablet Take by mouth.    . Fluticasone-Salmeterol (ADVAIR DISKUS) 100-50 MCG/DOSE AEPB     . losartan (COZAAR) 50 MG tablet     . Tobramycin-Dexamethasone 0.3-0.05 % SUSP 1 drop.     No current facility-administered medications for this visit.    Review of Systems:  GENERAL:  Feels fine.  No fevers, sweats or weight loss. PERFORMANCE STATUS (ECOG): 1 HEENT:  No visual changes, runny nose, sore throat, mouth sores or tenderness. Lungs: Shortness of breath at night.  Coughs up phlegm.  No hemoptysis. Cardiac:  No chest pain, palpitations, orthopnea, or PND. GI:  No nausea, vomiting, diarrhea, constipation, melena or hematochezia.  Declines a colonoscopy. GU:  No urgency, frequency, dysuria, or hematuria. Musculoskeletal:  No back pain. Knee stiff and sore.  No muscle tenderness. Extremities:  No pain or swelling. Skin:  No rashes or skin changes. Neuro:  No headache, numbness or weakness, balance or coordination issues. Endocrine:  No diabetes, thyroid issues, hot  flashes or night sweats. Psych:  No mood changes, depression or anxiety. Pain:  No focal pain. Review of systems:  All other systems reviewed and found to be negative.   Physical Exam: Blood pressure 144/67, pulse 69, temperature 97 F (36.1 C), temperature source Tympanic, height 5' 7"  (1.702 m), weight 118 lb 9.7 oz (53.799 kg). GENERAL:  Thin elderly gentleman sitting comfortably in the exam room in no acute distress.  Smells of  smoke. MENTAL STATUS:  Alert and oriented to person, place and time. HEAD:  Wearing a Michelin cap.  Brown hair with graying.  Male pattern baldness.  Mustache.  Normocephalic, atraumatic, face symmetric, no Cushingoid features. EYES:  Glasses.  Brown eyes.  No conjunctivitis or scleral icterus.Marland Kitchen EXTREMITIES: Right MCP abnormality s/p prior trauma (nearly transected).  Right hand nicotine stained nails.  NEUROLOGICAL: Unremarkable. PSYCH:  Appropriate.   No visits with results within 3 Day(s) from this visit. Latest known visit with results is:  [facility] Conversion on 03/31/2014  Component Date Value Ref Range Status  . WBC 03/31/2014 5.2  3.8-10.6 x10 3/mm 3 Final  . RBC 03/31/2014 3.97* 4.40-5.90 x10 6/mm 3 Final  . HGB 03/31/2014 12.0* 13.0-18.0 g/dL Final  . HCT 03/31/2014 37.1* 40.0-52.0 % Final  . MCV 03/31/2014 94  80-100 fL Final  . MCH 03/31/2014 30.4  26.0-34.0 pg Final  . MCHC 03/31/2014 32.4  32.0-36.0 g/dL Final  . RDW 03/31/2014 18.9* 11.5-14.5 % Final  . Platelet 03/31/2014 282  150-440 x10 3/mm 3 Final  . Neutrophil % 03/31/2014 60.5   Final  . Lymphocyte % 03/31/2014 31.4   Final  . Monocyte % 03/31/2014 6.7   Final  . Eosinophil % 03/31/2014 1.1   Final  . Basophil % 03/31/2014 0.3   Final  . Neutrophil # 03/31/2014 3.1  1.4-6.5 x10 3/mm 3 Final  . Lymphocyte # 03/31/2014 1.6  1.0-3.6 x10 3/mm 3 Final  . Monocyte # 03/31/2014 0.3  0.2-1.0 x10 3/mm  Final  . Eosinophil # 03/31/2014 0.1  0.0-0.7 x10 3/mm 3 Final  . Basophil # 03/31/2014 0.0  0.0-0.1 x10 3/mm 3 Final  . Glucose 03/31/2014 98  65-99 mg/dL Final  . BUN 03/31/2014 14  7-18 mg/dL Final  . Creatinine 03/31/2014 1.05  0.60-1.30 mg/dL Final  . Sodium 03/31/2014 139  136-145 mmol/L Final  . Potassium 03/31/2014 4.1  3.5-5.1 mmol/L Final  . Chloride 03/31/2014 102  98-107 mmol/L Final  . Co2 03/31/2014 30  21-32 mmol/L Final  . Calcium, Total 03/31/2014 9.5  8.5-10.1 mg/dL Final  . SGOT(AST) 03/31/2014 12*  15-37 Unit/L Final  . SGPT (ALT) 03/31/2014 9*  Final   Comment: 14-63 NOTE: New Reference Range 10/08/13   . Alkaline Phosphatase 03/31/2014 107   Final   Comment: 46-116 NOTE: New Reference Range 10/08/13   . Albumin 03/31/2014 3.1* 3.4-5.0 g/dL Final  . Total Protein 03/31/2014 8.8* 6.4-8.2 g/dL Final  . Bilirubin,Total 03/31/2014 0.4  0.2-1.0 mg/dL Final  . Osmolality 03/31/2014 278  275-301 Final  . Anion Gap 03/31/2014 7  7-16 Final  . EGFR (African American) 03/31/2014 >60  >38m/min Final  . EGFR (Non-African Amer.) 03/31/2014 >60  >636mmin Final   Comment: eGFR values <6050min/1.73 m2 may be an indication of chronic kidney disease (CKD). Calculated eGFR, using the MRDR Study equation, is useful in  patients with stable renal function. The eGFR calculation will not be reliable in acutely ill patients when serum creatinine is changing rapidly. It is not  useful in patients on dialysis. The eGFR calculation may not be applicable to patients at the low and high extremes of body sizes, pregnant women, and vegetarians.   . Uric Acid 03/31/2014 4.1  3.5-7.2 mg/dL Final    Assessment:  Nathaniel Armstrong is a 81 y.o. male with a history of pulmonary nodule first detected on chest CT in 02/2013 when he was admitted with bilateral pneumonia.  Follow-up imaging revealed resolution of pneumonia and persistent lung nodule.  He continues to smoke 1/2 pack per day.  Chest x-ray on 10/02/2014 revealed emphysema and chronic interstitial changes.    Chest CT on 10/08/2014 revealed central lobar and paraseptal emphysema with scattered pulmonary parenchymal scarring. There were no pathologically enlarged mediastinal or axillary lymph nodes. He has known coronary artery calcification.  Stress test in early 2016 was "fine".  Symptomatically, he has a rare productive cough.  Weight is stable.  Exam is unremarkable.  Plan: 1.  Review CT scan. 2.  Encourage smoking cessation. 3.  RTC  prn.   Lequita Asal, MD  10/13/2014, 3:31 PM

## 2014-10-13 NOTE — Progress Notes (Signed)
Pt here today for follow up and CT results; offers no complaints today

## 2015-04-24 ENCOUNTER — Other Ambulatory Visit
Admission: RE | Admit: 2015-04-24 | Discharge: 2015-04-24 | Disposition: A | Discharge status: Home / Self Care | Payer: Medicare Other | Source: Ambulatory Visit | Attending: Rheumatology | Admitting: Rheumatology

## 2015-04-24 DIAGNOSIS — M1A9XX0 Chronic gout, unspecified, without tophus (tophi): Secondary | ICD-10-CM | POA: Insufficient documentation

## 2015-04-24 LAB — SYNOVIAL CELL COUNT + DIFF, W/ CRYSTALS
Crystals, Fluid: NONE SEEN
EOSINOPHILS-SYNOVIAL: 0 %
LYMPHOCYTES-SYNOVIAL FLD: 6 %
MONOCYTE-MACROPHAGE-SYNOVIAL FLUID: 3 %
NEUTROPHIL, SYNOVIAL: 91 %
Other Cells-SYN: 0
WBC, Synovial: 2375 /mm3 — ABNORMAL HIGH (ref 0–200)

## 2015-05-01 ENCOUNTER — Encounter: Payer: Self-pay | Admitting: Hematology and Oncology

## 2015-10-01 ENCOUNTER — Encounter: Payer: Self-pay | Admitting: Urology

## 2015-10-01 ENCOUNTER — Ambulatory Visit (INDEPENDENT_AMBULATORY_CARE_PROVIDER_SITE_OTHER): Payer: Medicare Other | Admitting: Urology

## 2015-10-01 VITALS — BP 145/72 | HR 73 | Ht 67.0 in | Wt 112.7 lb

## 2015-10-01 DIAGNOSIS — R972 Elevated prostate specific antigen [PSA]: Secondary | ICD-10-CM | POA: Diagnosis not present

## 2015-10-01 NOTE — Progress Notes (Signed)
10/01/2015 11:46 AM   Nathaniel Armstrong [birth year] [id number]  Referring provider: [facility] Armstrong [place], [place] [id number]  Chief Complaint  Patient presents with  . New Patient (Initial Visit)    elevated PSA    HPI: The patient is an 82 year old gentleman presents for evaluation of an elevated PSA. He has PSA was 5.4 in February 2016. It was rechecked in June 2017 at which point it was 8.4. The patient has no urological complaints at this time. He is voiding without difficulty. He denies history of hematuria or nephrolithiasis.   PMH: Past Medical History  Diagnosis Date  . Hypertension   . COPD (chronic obstructive pulmonary disease) ([place])   . Gout     Surgical History: Past Surgical History  Procedure Laterality Date  . Hand surgery  1978    Home Medications:    Medication List       This list is accurate as of: 10/01/15 11:46 AM.  Always use your most recent med list.               ADVAIR DISKUS 100-50 MCG/DOSE Aepb  Generic drug:  Fluticasone-Salmeterol     allopurinol 100 MG tablet  Commonly known as:  ZYLOPRIM     amLODipine 10 MG tablet  Commonly known as:  NORVASC  Take by mouth.     colchicine 0.6 MG tablet  Take by mouth.     folic acid 1 MG tablet  Commonly known as:  FOLVITE     losartan 50 MG tablet  Commonly known as:  COZAAR     Tobramycin-Dexamethasone 0.3-0.05 % Susp  Reported on 10/01/2015     Vitamin D3 1000 units Caps  Take by mouth.        Allergies:  Allergies  Allergen Reactions  . Penicillins Other (See Comments)    Family History: Family History  Problem Relation Age of Onset  . Hypertension Mother   . Hypertension Father   . Stroke Sister   . Hypertension Sister   . Prostate cancer Neg Hx   . Kidney cancer Neg Hx     Social History:  reports that he has been smoking Cigarettes.  He has a 36 pack-year smoking history. He has never used smokeless tobacco. He reports that he  does not drink alcohol or use illicit drugs.  ROS: UROLOGY Frequent Urination?: No Hard to postpone urination?: No Burning/pain with urination?: No Get up at night to urinate?: Yes Leakage of urine?: No Urine stream starts and stops?: No Trouble starting stream?: No Do you have to strain to urinate?: No Blood in urine?: No Urinary tract infection?: No Sexually transmitted disease?: No Injury to kidneys or bladder?: No Painful intercourse?: No Weak stream?: No Erection problems?: No Penile pain?: No  Gastrointestinal Nausea?: No Vomiting?: No Indigestion/heartburn?: No Diarrhea?: No Constipation?: Yes  Constitutional Fever: No Night sweats?: No Weight loss?: No Fatigue?: No  Skin Skin rash/lesions?: No Itching?: No  Eyes Blurred vision?: No Double vision?: No  Ears/Nose/Throat Sore throat?: No Sinus problems?: Yes  Hematologic/Lymphatic Swollen glands?: No Easy bruising?: No  Cardiovascular Leg swelling?: No Chest pain?: No  Respiratory Cough?: Yes Shortness of breath?: Yes  Endocrine Excessive thirst?: No  Musculoskeletal Back pain?: No Joint pain?: Yes  Neurological Headaches?: No Dizziness?: No  Psychologic Depression?: No Anxiety?: No  Physical Exam: BP 145/72 mmHg  Pulse 73  Ht 5\' 7"  (1.702 m)  Wt 112 lb 11.2 oz (51.12 kg)  BMI  17.65 kg/m2  Constitutional:  Alert and oriented, No acute distress. HEENT: [place] AT, moist mucus membranes.  Trachea midline, no masses. Cardiovascular: No clubbing, cyanosis, or edema. Respiratory: Normal respiratory effort, no increased work of breathing. GI: Abdomen is soft, nontender, nondistended, no abdominal masses GU: No CVA tenderness. Patient is refusing a digital rectal exam at this time. Skin: No rashes, bruises or suspicious lesions. Lymph: No cervical or inguinal adenopathy. Neurologic: Grossly intact, no focal deficits, moving all 4 extremities. Psychiatric: Normal mood and  affect.  Laboratory Data: Lab Results  Component Value Date   WBC 5.2 03/31/2014   HGB 12.0* 03/31/2014   HCT 37.1* 03/31/2014   MCV 94 03/31/2014   PLT 282 03/31/2014    Lab Results  Component Value Date   CREATININE 1.05 03/31/2014    No results found for: PSA  No results found for: TESTOSTERONE  No results found for: HGBA1C  Urinalysis    Component Value Date/Time   COLORURINE Yellow 03/19/2013 2207   APPEARANCEUR Clear 03/19/2013 2207   LABSPEC 1.054 03/19/2013 2207   PHURINE 5.0 03/19/2013 2207   GLUCOSEU Negative 03/19/2013 2207   HGBUR 3+ 03/19/2013 2207   BILIRUBINUR Negative 03/19/2013 2207   KETONESUR Negative 03/19/2013 2207   PROTEINUR 30 mg/dL 03/19/2013 2207   NITRITE Negative 03/19/2013 2207   LEUKOCYTESUR Negative 03/19/2013 2207     Assessment & Plan:   1. Elevated PSA I had a long discussion with the patient, his wife, and his daughter regarding PSA testing. We did note that PSA testing is not typically performed in men over the age of 52. In fact, the guidelines suggest stopping PSA screening at the age of 32. However, since we now he has a PSA that is elevated, it is always a confusing situation as to what is best for the patient. We discussed options which include doing nothing, monitoring his PSA, and prostate biopsy. We discussed the natural history of prostate cancer which includes on average the prostate cancer does not metastasize for 8-10 years and does not become lethal for an average of another 5 years. We also discussed the most amount of the live long enough to follow-up prostate cancer which that'll die with the knot from. We also talked about the risks, benefits, indications of a prostate biopsy. The patient is not interested in going through this at this time. I think this is completely reasonable given his age. 2 sure that we're not missing an aggressive cancer though, we will follow his PSA with a repeat PSA in 6 months.  No Follow-up  on file.  Nickie Retort, MD  [facility] [address], [place], [place] [address] [phone]

## 2015-10-17 ENCOUNTER — Emergency Department
Admission: EM | Admit: 2015-10-17 | Discharge: 2015-10-17 | Disposition: A | Discharge status: Home / Self Care | Payer: Medicare Other | Attending: Emergency Medicine | Admitting: Emergency Medicine

## 2015-10-17 DIAGNOSIS — R319 Hematuria, unspecified: Secondary | ICD-10-CM | POA: Diagnosis not present

## 2015-10-17 DIAGNOSIS — Z79899 Other long term (current) drug therapy: Secondary | ICD-10-CM | POA: Diagnosis not present

## 2015-10-17 DIAGNOSIS — F17210 Nicotine dependence, cigarettes, uncomplicated: Secondary | ICD-10-CM | POA: Insufficient documentation

## 2015-10-17 DIAGNOSIS — I10 Essential (primary) hypertension: Secondary | ICD-10-CM | POA: Insufficient documentation

## 2015-10-17 DIAGNOSIS — J449 Chronic obstructive pulmonary disease, unspecified: Secondary | ICD-10-CM | POA: Insufficient documentation

## 2015-10-17 LAB — COMPREHENSIVE METABOLIC PANEL
ALT: 10 U/L — ABNORMAL LOW (ref 17–63)
AST: 18 U/L (ref 15–41)
Albumin: 4.5 g/dL (ref 3.5–5.0)
Alkaline Phosphatase: 113 U/L (ref 38–126)
Anion gap: 9 (ref 5–15)
BUN: 18 mg/dL (ref 6–20)
CO2: 26 mmol/L (ref 22–32)
Calcium: 9.6 mg/dL (ref 8.9–10.3)
Chloride: 104 mmol/L (ref 101–111)
Creatinine, Ser: 0.91 mg/dL (ref 0.61–1.24)
GFR calc non Af Amer: >60 mL/min (ref >60)
Glucose, Bld: 100 mg/dL — ABNORMAL HIGH (ref 65–99)
POTASSIUM: 3.8 mmol/L (ref 3.5–5.1)
Sodium: 139 mmol/L (ref 135–145)
Total Bilirubin: 0.7 mg/dL (ref 0.3–1.2)
Total Protein: 9.5 g/dL — ABNORMAL HIGH (ref 6.5–8.1)

## 2015-10-17 LAB — CBC
HCT: 39.8 % — ABNORMAL LOW (ref 40.0–52.0)
Hemoglobin: 13.3 g/dL (ref 13.0–18.0)
MCH: 30 pg (ref 26.0–34.0)
MCHC: 33.5 g/dL (ref 32.0–36.0)
MCV: 89.6 fL (ref 80.0–100.0)
PLATELETS: 187 10*3/uL (ref 150–440)
RBC: 4.44 MIL/uL (ref 4.40–5.90)
RDW: 18.5 % — AB (ref 11.5–14.5)
WBC: 5.6 10*3/uL (ref 3.8–10.6)

## 2015-10-17 LAB — URINALYSIS COMPLETE WITH MICROSCOPIC (ARMC ONLY)
BACTERIA UA: NONE SEEN
Specific Gravity, Urine: 1.016 (ref 1.005–1.030)
Squamous Epithelial / LPF: NONE SEEN

## 2015-10-17 MED ORDER — CIPROFLOXACIN HCL 500 MG PO TABS
500.0000 mg | ORAL_TABLET | Freq: Two times a day (BID) | ORAL | 0 refills | Status: AC
Start: 2015-10-17 — End: 2015-10-27

## 2015-10-17 NOTE — ED Triage Notes (Signed)
Pt c/o blood in urine today.. Denies pain or hx of the same.

## 2015-10-17 NOTE — ED Notes (Signed)
Pt in via triage with complaints of hematuria since this morning.  Pt denies any pain, denies any frequency or urgency w/ urination.  Pt A/Ox4, vitals WDL, no immediate distress at this time.

## 2015-10-17 NOTE — ED Provider Notes (Signed)
[facility] Emergency Department Provider Note   ____________________________________________    I have reviewed the triage vital signs and the nursing notes.   HISTORY  Chief Complaint Hematuria     HPI Nathaniel Armstrong is a 82 y.o. male who presents with complaints of blood in his urine.he first noticed this this morning. He denies pain. No flank pain no abdominal pain no penile pain. No fevers or chills. No dysuria. No history of UTIs or kidney stones. He recently saw a urologist for an elevated PSA, no specific actions were required just a follow-up in 6 months. No nausea vomiting or diaphoresis   Past Medical History:  Diagnosis Date  . COPD (chronic obstructive pulmonary disease) ([place])   . Gout   . Hypertension     Patient Active Problem List   Diagnosis Date Noted  . H/O cardiovascular disorder 04/09/2014  . CAFL (chronic airflow limitation) ([place]) 07/25/2013  . 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 BP (high blood pressure) 07/25/2013  . Lung mass 07/25/2013  . Chronic uveitis 07/25/2013  . Cigarette smoker 07/25/2013    Past Surgical History:  Procedure Laterality Date  . HAND SURGERY  1978    Prior to Admission medications   Medication Sig Start Date End Date Taking? Authorizing Provider  allopurinol (ZYLOPRIM) 100 MG tablet  10/01/14   Historical Provider, MD  amLODipine (NORVASC) 10 MG tablet Take by mouth.    Historical Provider, MD  Cholecalciferol (VITAMIN D3) 1000 UNITS CAPS Take by mouth.    Historical Provider, MD  ciprofloxacin (CIPRO) 500 MG tablet Take 1 tablet (500 mg total) by mouth 2 (two) times daily. 10/17/15 10/27/15  Lavonia Drafts, MD  colchicine 0.6 MG tablet Take by mouth.    Historical Provider, MD  Fluticasone-Salmeterol (ADVAIR DISKUS) 100-50 MCG/DOSE AEPB  10/22/13   Historical Provider, MD  folic acid (FOLVITE) 1 MG tablet  08/28/14   Historical Provider, MD  losartan (COZAAR) 50 MG tablet  09/16/14   Historical Provider,  MD  Tobramycin-Dexamethasone 0.3-0.05 % SUSP Reported on 10/01/2015    Historical Provider, MD  .   Allergies Penicillins  Family History  Problem Relation Age of Onset  . Hypertension Mother   . Hypertension Father   . Stroke Sister   . Hypertension Sister   . Prostate cancer Neg Hx   . Kidney cancer Neg Hx     Social History Social History  Substance Use Topics  . Smoking status: Current Every Day Smoker    Packs/day: 0.50    Years: 72.00    Types: Cigarettes  . Smokeless tobacco: Never Used  . Alcohol use No    Review of Systems  Constitutional: No fever/chills Eyes: No discharge ENT: No sore throat. Cardiovascular: Denies palpitations Respiratory: Denies cough Gastrointestinal: No abdominal pain.  No nausea, no vomiting.   Genitourinary: Negative for dysuria.positive for hematuria Musculoskeletal: Negative for back pain. Skin: Negative for rash.   10-point ROS otherwise negative.  ____________________________________________   PHYSICAL EXAM:  VITAL SIGNS: ED Triage Vitals [10/17/15 1106]  Enc Vitals Group     BP (!) 138/103     Pulse Rate 82     Resp 16     Temp 97.7 F (36.5 C)     Temp Source Oral     SpO2 98 %     Weight 110 lb (49.9 kg)     Height 5\' 7"  (1.702 m)     Head Circumference      Peak  Flow      Pain Score      Pain Loc      Pain Edu?      Excl. in [place]?     Constitutional: Alert and oriented. No acute distress. Pleasant and interactive Eyes: Conjunctivae are normal.  Mouth/Throat: Mucous membranes are moist.    Cardiovascular: Normal rate, regular rhythm. Good peripheral circulation. Respiratory: Normal respiratory effort.  No retractions. Gastrointestinal: Soft and nontender. No distention.  No CVA tenderness. Genitourinary: deferred Musculoskeletal: No lower extremity tenderness nor edema.  Warm and well perfused Neurologic:  Normal speech and language. No gross focal neurologic deficits are appreciated.  Skin:  Skin is  warm, dry and intact. No rash noted. Psychiatric: Mood and affect are normal. Speech and behavior are normal.  ____________________________________________   LABS (all labs ordered are listed, but only abnormal results are displayed)  Labs Reviewed  URINALYSIS COMPLETEWITH MICROSCOPIC ([place]) - Abnormal; Notable for the following:       Result Value   Color, Urine RED (*)    APPearance TURBID (*)    Glucose, UA   (*)    Value: TEST NOT REPORTED DUE TO COLOR INTERFERENCE OF URINE PIGMENT   Bilirubin Urine   (*)    Value: TEST NOT REPORTED DUE TO COLOR INTERFERENCE OF URINE PIGMENT   Ketones, ur   (*)    Value: TEST NOT REPORTED DUE TO COLOR INTERFERENCE OF URINE PIGMENT   Hgb urine dipstick   (*)    Value: TEST NOT REPORTED DUE TO COLOR INTERFERENCE OF URINE PIGMENT   Protein, ur   (*)    Value: TEST NOT REPORTED DUE TO COLOR INTERFERENCE OF URINE PIGMENT   Nitrite   (*)    Value: TEST NOT REPORTED DUE TO COLOR INTERFERENCE OF URINE PIGMENT   Leukocytes, UA   (*)    Value: TEST NOT REPORTED DUE TO COLOR INTERFERENCE OF URINE PIGMENT   All other components within normal limits  CBC - Abnormal; Notable for the following:    HCT 39.8 (*)    RDW 18.5 (*)    All other components within normal limits  COMPREHENSIVE METABOLIC PANEL - Abnormal; Notable for the following:    Glucose, Bld 100 (*)    Total Protein 9.5 (*)    ALT 10 (*)    All other components within normal limits  URINE CULTURE   ____________________________________________  EKG  None ____________________________________________  RADIOLOGY  None ____________________________________________   PROCEDURES  Procedure(s) performed: No    Critical Care performed: No ____________________________________________   INITIAL IMPRESSION / ASSESSMENT AND PLAN / ED COURSE  Pertinent labs & imaging results that were available during my care of the patient were reviewed by me and considered in my medical  decision making (see chart for details).  Patient presents with hematuria. He has no pain making ureterolithiasis unlikely. Possibility of UTI.We will check a  Urinalysis, CBC, BMP and discuss with urology if needed  Clinical Course  Numerous red blood cells and white blood cells, unclear if nitrites or leukocytes, we will put the patient on antibiotics and have him follow-up with urology ____________________________________________   FINAL CLINICAL IMPRESSION(S) / ED DIAGNOSES  Final diagnoses:  Hematuria      NEW MEDICATIONS STARTED DURING THIS VISIT:  New Prescriptions   CIPROFLOXACIN (CIPRO) 500 MG TABLET    Take 1 tablet (500 mg total) by mouth 2 (two) times daily.     Note:  This document was prepared using  [employer] forces training and education officer and may include unintentional dictation errors.    Lavonia Drafts, MD 10/17/15 [phone]

## 2015-10-18 LAB — URINE CULTURE: Culture: <10000 — AB

## 2015-10-27 ENCOUNTER — Encounter: Payer: Self-pay | Admitting: Urology

## 2015-10-27 ENCOUNTER — Ambulatory Visit (INDEPENDENT_AMBULATORY_CARE_PROVIDER_SITE_OTHER): Payer: Medicare Other | Admitting: Urology

## 2015-10-27 ENCOUNTER — Telehealth: Payer: Self-pay | Admitting: Urology

## 2015-10-27 VITALS — BP 151/78 | HR 79 | Ht 67.0 in | Wt 115.8 lb

## 2015-10-27 DIAGNOSIS — R310 Gross hematuria: Secondary | ICD-10-CM

## 2015-10-27 DIAGNOSIS — R972 Elevated prostate specific antigen [PSA]: Secondary | ICD-10-CM

## 2015-10-27 LAB — URINALYSIS, COMPLETE
Bilirubin, UA: NEGATIVE
Glucose, UA: NEGATIVE
Ketones, UA: NEGATIVE
LEUKOCYTES UA: NEGATIVE
Nitrite, UA: NEGATIVE
PH UA: 6 (ref 5.0–7.5)
Specific Gravity, UA: <1.005 — ABNORMAL LOW (ref 1.005–1.030)
Urobilinogen, Ur: 0.2 mg/dL (ref 0.2–1.0)

## 2015-10-27 LAB — MICROSCOPIC EXAMINATION: RBC, UA: >30 /hpf — ABNORMAL HIGH (ref 0–?)

## 2015-10-27 MED ORDER — FINASTERIDE 5 MG PO TABS: 5.0000 mg | ORAL_TABLET | Freq: Every day | ORAL | 3 refills | Status: AC

## 2015-10-27 NOTE — Telephone Encounter (Signed)
Patient's daughter called today and said that her dad needed an appt asap due to hematuria. She said that he had to go to the Ed ON 10-17-15 and was told to follow up with [place] but he would not come in so she never called. I then asked her how bad the bleeding was? She said she didn't know, I told her if it was bad and he was soaking his underwear that he needed to go straight to the ED. This was at 8:15am, I asked if he could get here by 8:45 and she said no, so I offered her an 11:30 appt and she took it. I told her as long as he was stable to go ahead and bring him in then, but if he was not to go to the ED. I spoke with Larene Beach and she agreed with this advise.   Sharyn Lull

## 2015-10-27 NOTE — Progress Notes (Signed)
10/27/2015 11:51 AM   Nathaniel Armstrong 1932/10/02 [id number]  Referring provider: [facility] [place], [place] [address]  Chief Complaint  Patient presents with  . Hematuria    saw in er     HPI: Patient is a 82 -year-old African American who presents today with his family as a referral [facility] for gross hematuria.    He does not have a prior history of recurrent urinary tract infections, nephrolithiasis, trauma to the genitourinary tract, BPH or malignancies of the genitourinary tract.   He does not have a family medical history of nephrolithiasis, malignancies of the genitourinary tract or hematuria.   Today, he is not having symptoms of frequent urination, urgency, dysuria, nocturia, incontinence, hesitancy, intermittency, straining to urinate or a weak urinary stream.  His UA today demonstrates > 30 RBC's/hpf.  He is not experiencing any suprapubic pain, abdominal pain or flank pain. He denies any recent fevers, chills, nausea or vomiting.   He underwent a CT Angio in 2015 noted bilateral kidney hypo attenuating renal lesions.    He is a smoker.   He was a Horticulturist, commercial in the past.     He was seen earlier this summer for an elevated PSA of 8.4 in June 2017.  He was to RTC in 6 months for a repeat PSA.     PMH: Past Medical History:  Diagnosis Date  . COPD (chronic obstructive pulmonary disease) ([place])   . Gout   . Hypertension     Surgical History: Past Surgical History:  Procedure Laterality Date  . HAND SURGERY  1978    Home Medications:    Medication List       Accurate as of 10/27/15 11:51 AM. Always use your most recent med list.          albuterol 108 (90 Base) MCG/ACT inhaler Commonly known as:  PROVENTIL HFA;VENTOLIN HFA Inhale 2 puffs into the lungs every 6 (six) hours as needed for wheezing or shortness of breath.   allopurinol 100 MG tablet Commonly known as:  ZYLOPRIM Take 100 mg by mouth 2 (two)  times daily.   amLODipine 10 MG tablet Commonly known as:  NORVASC Take 10 mg by mouth daily.   ciprofloxacin 500 MG tablet Commonly known as:  CIPRO Take 1 tablet (500 mg total) by mouth 2 (two) times daily.   finasteride 5 MG tablet Commonly known as:  PROSCAR Take 1 tablet (5 mg total) by mouth daily.   losartan 50 MG tablet Commonly known as:  COZAAR Take 50 mg by mouth daily.   methotrexate 2.5 MG tablet Commonly known as:  RHEUMATREX Take by mouth.   Vitamin D3 1000 units Caps Take 1 capsule by mouth daily.       Allergies:  Allergies  Allergen Reactions  . Penicillins Other (See Comments)    Family History: Family History  Problem Relation Age of Onset  . Hypertension Mother   . Hypertension Father   . Stroke Sister   . Hypertension Sister   . Prostate cancer Neg Hx   . Kidney cancer Neg Hx     Social History:  reports that he has been smoking Cigarettes.  He has a 36.00 pack-year smoking history. He has never used smokeless tobacco. He reports that he does not drink alcohol or use drugs.  ROS: UROLOGY Frequent Urination?: No Hard to postpone urination?: No Burning/pain with urination?: No Get up at night to urinate?: No  Leakage of urine?: No Urine stream starts and stops?: No Trouble starting stream?: No Do you have to strain to urinate?: No Blood in urine?: Yes Urinary tract infection?: No Sexually transmitted disease?: No Injury to kidneys or bladder?: No Painful intercourse?: No Weak stream?: No Erection problems?: No Penile pain?: No  Gastrointestinal Nausea?: No Vomiting?: No Indigestion/heartburn?: No Diarrhea?: No Constipation?: No  Constitutional Fever: No Night sweats?: No Weight loss?: No Fatigue?: No  Skin Skin rash/lesions?: No Itching?: No  Eyes Blurred vision?: No Double vision?: No  Ears/Nose/Throat Sore throat?: No Sinus problems?: No  Hematologic/Lymphatic Swollen glands?: No Easy bruising?:  No  Cardiovascular Leg swelling?: No Chest pain?: No  Respiratory Cough?: Yes Shortness of breath?: Yes  Endocrine Excessive thirst?: No  Musculoskeletal Back pain?: No Joint pain?: No  Neurological Headaches?: No Dizziness?: No  Psychologic Depression?: No Anxiety?: No  Physical Exam: BP (!) 151/78   Pulse 79   Ht 5\' 7"  (1.702 m)   Wt 115 lb 12.8 oz (52.5 kg)   BMI 18.14 kg/m   Constitutional: Well nourished. Alert and oriented, No acute distress. HEENT:  AT, moist mucus membranes. Trachea midline, no masses. Cardiovascular: No clubbing, cyanosis, or edema. Respiratory: Normal respiratory effort, no increased work of breathing. GI: Abdomen is soft, non tender, non distended, no abdominal masses. Liver and spleen not palpable.  No hernias appreciated.  Stool sample for occult testing is not indicated.   GU: No CVA tenderness.  No bladder fullness or masses.   Rectal: Deferred. Skin: No rashes, bruises or suspicious lesions. Lymph: No cervical or inguinal adenopathy. Neurologic: Grossly intact, no focal deficits, moving all 4 extremities. Psychiatric: Normal mood and affect.  Laboratory Data: Lab Results  Component Value Date   WBC 5.6 10/17/2015   HGB 13.3 10/17/2015   HCT 39.8 (L) 10/17/2015   MCV 89.6 10/17/2015   PLT 187 10/17/2015    Lab Results  Component Value Date   CREATININE 0.91 10/17/2015    Lab Results  Component Value Date   AST 18 10/17/2015   Lab Results  Component Value Date   ALT 10 (L) 10/17/2015    Urinalysis Significant for > 30 RBC's/hpf.  See EPIC.    Assessment & Plan:    1. Gross hematuria  -  I explained to the patient that there are a number of causes that can be associated with blood in the urine, such as stones,  BPH, UTI's, damage to the urinary tract and/or cancer.   - At this time, I felt that the patient warranted further urologic evaluation.   The AUA guidelines state that a CT urogram is the preferred  imaging study to evaluate hematuria.   - I explained to the patient that a contrast material will be injected into a vein and that in rare instances, an allergic reaction can result and may even life threatening   The patient denies any allergies to contrast, iodine and/or seafood and is not taking metformin.   - Following the imaging study,  I've recommended a cystoscopy. I described how this is performed, typically in an office setting with a flexible cystoscope. We described the risks, benefits, and possible side effects, the most common of which is a minor amount of blood in the urine and/or burning which usually resolves in 24 to 48 hours.     -The patient had the opportunity to ask questions which were answered. Based upon this discussion, the patient is willing to proceed. Therefore, I've ordered: a  CT Urogram and cystoscopy.   - He will return following all of the above for discussion of the results.     - Urinalysis, Complete  2. Elevated PSA  - RTC in 6 months for PSA   Return for CT Urogram report and cystoscopy.  These notes generated with voice recognition software. I apologize for typographical errors.  Zara Council, [facility] [address], [place], [place] [address] [phone]

## 2015-10-28 LAB — BUN+CREAT
BUN/Creatinine Ratio: 21 (ref 10–24)
BUN: 20 mg/dL (ref 8–27)
Creatinine, Ser: 0.94 mg/dL (ref 0.76–1.27)
GFR calc Af Amer: 88 mL/min/{1.73_m2} (ref >59)
GFR calc non Af Amer: 76 mL/min/{1.73_m2} (ref >59)

## 2015-11-11 ENCOUNTER — Ambulatory Visit
Admission: RE | Admit: 2015-11-11 | Discharge: 2015-11-11 | Disposition: A | Discharge status: Home / Self Care | Payer: Medicare Other | Source: Ambulatory Visit | Attending: Urology | Admitting: Urology

## 2015-11-11 DIAGNOSIS — N281 Cyst of kidney, acquired: Secondary | ICD-10-CM | POA: Diagnosis not present

## 2015-11-11 DIAGNOSIS — R310 Gross hematuria: Secondary | ICD-10-CM | POA: Diagnosis present

## 2015-11-11 DIAGNOSIS — M87852 Other osteonecrosis, left femur: Secondary | ICD-10-CM | POA: Diagnosis not present

## 2015-11-11 DIAGNOSIS — I700 Atherosclerosis of aorta: Secondary | ICD-10-CM | POA: Diagnosis not present

## 2015-11-11 DIAGNOSIS — I7409 Other arterial embolism and thrombosis of abdominal aorta: Secondary | ICD-10-CM | POA: Insufficient documentation

## 2015-11-11 MED ORDER — IOPAMIDOL (ISOVUE-300) INJECTION 61%
100.0000 mL | Freq: Once | INTRAVENOUS | Status: AC | PRN
  Administered 2015-11-11: 100 mL via INTRAVENOUS

## 2015-11-19 ENCOUNTER — Other Ambulatory Visit: Payer: Medicare Other

## 2015-12-11 ENCOUNTER — Other Ambulatory Visit: Payer: Medicare Other | Admitting: Urology

## 2015-12-14 ENCOUNTER — Telehealth: Payer: Self-pay | Admitting: Urology

## 2015-12-14 NOTE — Telephone Encounter (Signed)
I made him an appt for this Thursday. I called but could not leave a message, no voicemail. I will keep trying him until I get him to give him this appt.  Sharyn Lull

## 2015-12-14 NOTE — Telephone Encounter (Signed)
-----   Message from Nori Riis, PA-C sent at 12/12/2015  1:40 PM EDT ----- Patient needs to be rescheduled for his cystoscopy [facility]

## 2015-12-15 ENCOUNTER — Ambulatory Visit (INDEPENDENT_AMBULATORY_CARE_PROVIDER_SITE_OTHER): Payer: Medicare Other | Admitting: Urology

## 2015-12-15 ENCOUNTER — Encounter: Payer: Self-pay | Admitting: Urology

## 2015-12-15 ENCOUNTER — Other Ambulatory Visit: Payer: Self-pay | Admitting: Radiology

## 2015-12-15 ENCOUNTER — Telehealth: Payer: Self-pay | Admitting: Radiology

## 2015-12-15 VITALS — BP 152/79 | HR 101 | Ht 67.0 in | Wt 113.0 lb

## 2015-12-15 DIAGNOSIS — R310 Gross hematuria: Secondary | ICD-10-CM | POA: Diagnosis not present

## 2015-12-15 DIAGNOSIS — R972 Elevated prostate specific antigen [PSA]: Secondary | ICD-10-CM

## 2015-12-15 DIAGNOSIS — Z720 Tobacco use: Secondary | ICD-10-CM

## 2015-12-15 DIAGNOSIS — D494 Neoplasm of unspecified behavior of bladder: Secondary | ICD-10-CM

## 2015-12-15 DIAGNOSIS — C671 Malignant neoplasm of dome of bladder: Secondary | ICD-10-CM

## 2015-12-15 LAB — URINALYSIS, COMPLETE
Bilirubin, UA: NEGATIVE
Glucose, UA: NEGATIVE
KETONES UA: NEGATIVE
NITRITE UA: NEGATIVE
Protein, UA: NEGATIVE
Specific Gravity, UA: 1.01 (ref 1.005–1.030)
UUROB: 0.2 mg/dL (ref 0.2–1.0)
pH, UA: 5.5 (ref 5.0–7.5)

## 2015-12-15 LAB — MICROSCOPIC EXAMINATION: BACTERIA UA: NONE SEEN

## 2015-12-15 MED ORDER — LIDOCAINE HCL 2 % EX GEL
1.0000 "application " | Freq: Once | CUTANEOUS | Status: AC
  Administered 2015-12-15: 1 via URETHRAL

## 2015-12-15 MED ORDER — CIPROFLOXACIN HCL 500 MG PO TABS
500.0000 mg | ORAL_TABLET | Freq: Once | ORAL | Status: AC
  Administered 2015-12-15: 500 mg via ORAL

## 2015-12-15 NOTE — Telephone Encounter (Signed)
Notified pt's daughter, Raciel Rierson, of surgery scheduled with Dr Erlene Quan on 12/21/15, pre-admit testing appt on 9/27//17 @12 :00 & to call Friday prior to surgery for arrival time to SDS. Lelan Pons voices understanding.

## 2015-12-15 NOTE — Progress Notes (Signed)
12/15/2015 3:33 PM   Nathaniel Armstrong 1933-02-26 [id number]  Referring provider: [facility] [place], [place] [address]  Chief Complaint  Patient presents with  . Cysto    HPI: 82 year old male who presents today for further evaluation of gross hematuria. He was seen and evaluated by Zara Council on 10/27/2015 at which time he experienced episodes of painless gross hematuria.  He's had no further episodes of bleeding since his initial evaluation.  As part of his further workup, he underwent CT urogram which shows a broad-based filling defect arising from the right lateral wall of the bladder measuring 1.3 cm highly suspicious for urothelial carcinoma. No other upper tract filling defects appreciated.  No evidence of pelvic lymphadenopathy or disease outside of the bladder.  He is a current smoker.  He was previously seen and evaluated in our office for an elevated PSA of 5.4 which rose to a 8.4 and is being followed clinically based his age with q6 month PSA.    PMH: Past Medical History:  Diagnosis Date  . COPD (chronic obstructive pulmonary disease) ([place])   . Gout   . Hypertension     Surgical History: Past Surgical History:  Procedure Laterality Date  . HAND SURGERY  1978    Home Medications:    Medication List       Accurate as of 12/15/15  3:33 PM. Always use your most recent med list.          albuterol 108 (90 Base) MCG/ACT inhaler Commonly known as:  PROVENTIL HFA;VENTOLIN HFA Inhale 2 puffs into the lungs every 6 (six) hours as needed for wheezing or shortness of breath.   allopurinol 100 MG tablet Commonly known as:  ZYLOPRIM Take 100 mg by mouth 2 (two) times daily.   amLODipine 10 MG tablet Commonly known as:  NORVASC Take 10 mg by mouth daily.   finasteride 5 MG tablet Commonly known as:  PROSCAR Take 1 tablet (5 mg total) by mouth daily.   losartan 50 MG tablet Commonly known as:  COZAAR Take 50 mg by  mouth daily.   methotrexate 2.5 MG tablet Commonly known as:  RHEUMATREX Take by mouth.   Vitamin D3 1000 units Caps Take 1 capsule by mouth daily.       Allergies:  Allergies  Allergen Reactions  . Penicillins Other (See Comments)    Family History: Family History  Problem Relation Age of Onset  . Hypertension Mother   . Hypertension Father   . Stroke Sister   . Hypertension Sister   . Prostate cancer Neg Hx   . Kidney cancer Neg Hx     Social History:  reports that he has been smoking Cigarettes.  He has a 36.00 pack-year smoking history. He has never used smokeless tobacco. He reports that he does not drink alcohol or use drugs.   Physical Exam: BP (!) 152/79   Pulse (!) 101   Ht 5\' 7"  (1.702 m)   Wt 113 lb (51.3 kg)   BMI 17.70 kg/m   Constitutional:  Alert and oriented, No acute distress. HEENT: [place] AT, moist mucus membranes.  Trachea midline, no masses. Cardiovascular: No clubbing, cyanosis, or edema. RRR. Respiratory: Normal respiratory effort, no increased work of breathing. CTAB. GI: Abdomen is soft, nontender, nondistended.  Large reducible umbilical hernia appreciated. GU: No CVA tenderness. Normal circumcised phallus. Skin: No rashes, bruises or suspicious lesions. Neurologic: Grossly intact, no focal deficits, moving all 4 extremities. Psychiatric: Normal  mood and affect.  Laboratory Data: Lab Results  Component Value Date   WBC 5.6 10/17/2015   HGB 13.3 10/17/2015   HCT 39.8 (L) 10/17/2015   MCV 89.6 10/17/2015   PLT 187 10/17/2015    Lab Results  Component Value Date   CREATININE 0.94 10/27/2015    Urinalysis UA reviewed, no evidence of active infection  Pertinent Imaging: CLINICAL DATA:  Painless hematuria  EXAM: CT ABDOMEN AND PELVIS WITHOUT AND WITH CONTRAST  TECHNIQUE: Multidetector CT imaging of the abdomen and pelvis was performed following the standard protocol before and following the bolus administration of  intravenous contrast.  CONTRAST:  185mL ISOVUE-300 IOPAMIDOL (ISOVUE-300) INJECTION 61%  COMPARISON:  03/18/2014  FINDINGS: Lower chest: Scar noted in the right lower lobe. No pleural or pericardial effusion. Diffuse bronchial wall thickening identified and there are changes suggestive of emphysema.  Hepatobiliary: The liver appears normal. The gallbladder is unremarkable. No biliary dilatation.  Pancreas: No inflammation or mass identified.  Spleen: The spleen is unremarkable.  Adrenals/Urinary Tract: The adrenal glands are both normal. No kidney stones identified. There is no hydronephrosis or hydroureter. The urinary bladder is unremarkable. There are small bilateral renal cysts identified. On the delayed images there is symmetric opacification of the collecting systems. There is a broad-based, enhancing filling defect arising from the right lateral bladder wall measuring 1.3 cm, image 58 of series 12.  Stomach/Bowel: The stomach is within normal limits. The small bowel loops have a normal course and caliber. No obstruction. Normal appearance of the colon.  Vascular/Lymphatic: Aortic atherosclerosis is identified. The aorta measures 2.3 cm in AP dimension. 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  Reproductive: Prostate gland is enlarged.  Other: There is no ascites or focal fluid collections within the abdomen or pelvis.  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 Multi level degenerative disc disease is noted throughout the lumbar spine. No suspicious lytic or sclerotic bone lesions identified.  IMPRESSION: 1. Examination is positive for enhancing filling defect within the right lateral bladder wall compatible with urothelial carcinoma. Correlation with direct visualization and tissue sampling advise. 2. Kidney cysts. 3. 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 Left hip AVN.   Electronically Signed   By: Kerby Moors M.D.   On: 11/11/2015 14:20  CT scan personally reviewed today.   Cystoscopy Procedure Note  Patient identification was confirmed, informed consent was obtained, and patient was prepped using Betadine solution.  Lidocaine jelly was administered per urethral meatus.    Preoperative abx where received prior to procedure.     Pre-Procedure: - Inspection reveals a normal caliber ureteral meatus.  Procedure: The flexible cystoscope was introduced without difficulty - No urethral strictures/lesions are present. - Enlarged prostate with trilobar coapation - Elevated bladder neck (mild) - Bilateral ureteral orifices identified - Bladder mucosa reveals broad-based nodular tumor on dome of bladder, primarily the right dome measuring approximately 2 cm highly concerning for TCC - No bladder stones - Mild trabeculation   Post-Procedure: - Procedure was not well tolerated today therefore visualization was somewhat limited  Assessment & Plan:    1. Gross hematuria S/p work up including CT Urogram and cystoscopy revealing bladder tumor concerning for TCC, see below - Urinalysis, Complete - ciprofloxacin (CIPRO) tablet 500 mg; Take 1 tablet (500 mg total) by mouth once. - lidocaine (XYLOCAINE) 2 % jelly 1 application; Place 1 application into the urethra  once. - CULTURE, URINE COMPREHENSIVE  2. 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 Risks and benefits were reviewed today in detail. All of his questions along with his wife and daughters were answered. Risk of bleeding, damage to surrounding structures, bladder irritation, infection, bladder  perforation were discussed in detail. Possible need for Foley catheter was also discussed. She would like to proceed as planned.  Preop UCx  3. Tobacco abuse Discussed causal relationship between smoking and bladder cancer. Patient is unwilling to stop at this time.  4. Elevated PSA History of elevated PSA, based on his age, we'll continue to follow conservatively due for PSA at 6 month interval as previously discussed  Schedule TURBT, instillation of mitomycin  Hollice Espy, MD  [facility] [address], [place], [place] [address] [phone]

## 2015-12-16 ENCOUNTER — Other Ambulatory Visit: Payer: Medicare Other

## 2015-12-16 ENCOUNTER — Other Ambulatory Visit: Payer: Medicare Other | Admitting: Urology

## 2015-12-16 ENCOUNTER — Encounter
Admission: RE | Admit: 2015-12-16 | Discharge: 2015-12-16 | Disposition: A | Discharge status: Home / Self Care | Payer: Medicare Other | Source: Ambulatory Visit | Attending: Urology | Admitting: Urology

## 2015-12-16 DIAGNOSIS — J449 Chronic obstructive pulmonary disease, unspecified: Secondary | ICD-10-CM | POA: Diagnosis not present

## 2015-12-16 DIAGNOSIS — Z79899 Other long term (current) drug therapy: Secondary | ICD-10-CM | POA: Insufficient documentation

## 2015-12-16 DIAGNOSIS — Z720 Tobacco use: Secondary | ICD-10-CM | POA: Diagnosis not present

## 2015-12-16 DIAGNOSIS — C671 Malignant neoplasm of dome of bladder: Secondary | ICD-10-CM | POA: Insufficient documentation

## 2015-12-16 DIAGNOSIS — R310 Gross hematuria: Secondary | ICD-10-CM | POA: Diagnosis not present

## 2015-12-16 DIAGNOSIS — Z01810 Encounter for preprocedural cardiovascular examination: Secondary | ICD-10-CM | POA: Diagnosis present

## 2015-12-16 DIAGNOSIS — Z880 Allergy status to penicillin: Secondary | ICD-10-CM | POA: Diagnosis not present

## 2015-12-16 DIAGNOSIS — R972 Elevated prostate specific antigen [PSA]: Secondary | ICD-10-CM | POA: Insufficient documentation

## 2015-12-16 DIAGNOSIS — Z9889 Other specified postprocedural states: Secondary | ICD-10-CM | POA: Diagnosis not present

## 2015-12-16 DIAGNOSIS — I10 Essential (primary) hypertension: Secondary | ICD-10-CM | POA: Diagnosis not present

## 2015-12-16 DIAGNOSIS — M109 Gout, unspecified: Secondary | ICD-10-CM | POA: Insufficient documentation

## 2015-12-16 DIAGNOSIS — Z01812 Encounter for preprocedural laboratory examination: Secondary | ICD-10-CM | POA: Insufficient documentation

## 2015-12-16 HISTORY — DX: Reserved for inherently not codable concepts without codable children: IMO0001

## 2015-12-16 HISTORY — DX: Unspecified osteoarthritis, unspecified site: M19.90

## 2015-12-16 HISTORY — DX: Peripheral vascular disease, unspecified: I73.9

## 2015-12-16 HISTORY — DX: Benign prostatic hyperplasia without lower urinary tract symptoms: N40.0

## 2015-12-16 LAB — CBC
HCT: 37.1 % — ABNORMAL LOW (ref 40.0–52.0)
Hemoglobin: 12.5 g/dL — ABNORMAL LOW (ref 13.0–18.0)
MCH: 30.2 pg (ref 26.0–34.0)
MCHC: 33.6 g/dL (ref 32.0–36.0)
MCV: 90 fL (ref 80.0–100.0)
PLATELETS: 196 10*3/uL (ref 150–440)
RBC: 4.13 MIL/uL — AB (ref 4.40–5.90)
RDW: 17.7 % — AB (ref 11.5–14.5)
WBC: 6.4 10*3/uL (ref 3.8–10.6)

## 2015-12-16 LAB — BASIC METABOLIC PANEL
Anion gap: 8 (ref 5–15)
BUN: 22 mg/dL — AB (ref 6–20)
CO2: 27 mmol/L (ref 22–32)
CREATININE: 0.9 mg/dL (ref 0.61–1.24)
Calcium: 9.6 mg/dL (ref 8.9–10.3)
Chloride: 105 mmol/L (ref 101–111)
Glucose, Bld: 94 mg/dL (ref 65–99)
Potassium: 3.7 mmol/L (ref 3.5–5.1)
SODIUM: 140 mmol/L (ref 135–145)

## 2015-12-16 NOTE — Patient Instructions (Signed)
  Your procedure is scheduled on: 12/21/15 Mon Report to Same Day Surgery 2nd floor medical mall To find out your arrival time please call [phone] between 1PM - 3PM on 12/18/15 Fri  Remember: Instructions that are not followed completely may result in serious medical risk, up to and including death, or upon the discretion of your surgeon and anesthesiologist your surgery may need to be rescheduled.    _x___ 1. Do not eat food or drink liquids after midnight. No gum chewing or hard candies.     __x__ 2. No Alcohol for 24 hours before or after surgery.   __x__3. No Smoking for 24 prior to surgery.   ____  4. Bring all medications with you on the day of surgery if instructed.    __x__ 5. Notify your doctor if there is any change in your medical condition     (cold, fever, infections).     Do not wear jewelry, make-up, hairpins, clips or nail polish.  Do not wear lotions, powders, or perfumes. You may wear deodorant.  Do not shave 48 hours prior to surgery. Men may shave face and neck.  Do not bring valuables to the hospital.    [facility] is not responsible for any belongings or valuables.               Contacts, dentures or bridgework may not be worn into surgery.  Leave your suitcase in the car. After surgery it may be brought to your room.  For patients admitted to the hospital, discharge time is determined by your treatment team.   Patients discharged the day of surgery will not be allowed to drive home.    Please read over the following fact sheets that you were given:   [facility] Preparing for Surgery and or MRSA Information   _x___ Take these medicines the morning of surgery with A SIP OF WATER:    1. albuterol (PROVENTIL HFA;VENTOLIN HFA) 108 (90 Base) MCG/ACT inhaler  2.amLODipine (NORVASC) 10 MG tablet  3.beclomethasone (QVAR) 40 MCG/ACT inhaler  4.losartan (COZAAR) 50 MG tablet  5.  6.  ____Fleets enema or Magnesium Citrate as directed.   _x___ Use CHG Soap  or sage wipes as directed on instruction sheet   __x__ Use inhalers on the day of surgery and bring to hospital day of surgery  ____ Stop metformin 2 days prior to surgery    ____ Take 1/2 of usual insulin dose the night before surgery and none on the morning of           surgery.   ____ Stop aspirin or coumadin, or plavix  x__ Stop Anti-inflammatories such as Advil, Aleve, Ibuprofen, Motrin, Naproxen,          Naprosyn, Goodies powders or aspirin products. Ok to take Tylenol.   ____ Stop supplements until after surgery.    ____ Bring C-Pap to the hospital.

## 2015-12-17 NOTE — Pre-Procedure Instructions (Signed)
ekg compared with previous. 

## 2015-12-18 ENCOUNTER — Telehealth: Payer: Self-pay

## 2015-12-18 ENCOUNTER — Telehealth: Payer: Self-pay | Admitting: Radiology

## 2015-12-18 DIAGNOSIS — N390 Urinary tract infection, site not specified: Secondary | ICD-10-CM

## 2015-12-18 LAB — CULTURE, URINE COMPREHENSIVE

## 2015-12-18 MED ORDER — CIPROFLOXACIN HCL 500 MG PO TABS: 500.0000 mg | ORAL_TABLET | Freq: Two times a day (BID) | ORAL | 0 refills | Status: DC

## 2015-12-18 NOTE — Telephone Encounter (Signed)
-----   Message from Hollice Espy, MD sent at 12/18/2015 10:28 AM EDT ----- Please treat low grade UTI with cipro 500 mg bid x 7 days.    Hollice Espy, MD

## 2015-12-18 NOTE — Telephone Encounter (Signed)
Concerns addressed with daughter.    Hollice Espy, MD

## 2015-12-18 NOTE — Telephone Encounter (Signed)
Pt's daughter, Emran Grgas, called with concerns about pt's upcoming TURBT scheduled 12/21/15. She is concerned about the safety of doing the procedure given pt's age & possibility of "cancer spreading when you mess with it". Requests call back from Dr Erlene Quan at [phone].

## 2015-12-18 NOTE — Telephone Encounter (Signed)
Spoke with pt daughter, Lelan Pons, in reference to +ucx. Made Lelan Pons aware medication has been sent to pharmacy. Lelan Pons voiced understanding.

## 2015-12-21 ENCOUNTER — Ambulatory Visit: Payer: Medicare Other | Admitting: Anesthesiology

## 2015-12-21 ENCOUNTER — Encounter
Admission: RE | Disposition: A | Discharge status: Home / Self Care | Payer: Self-pay | Source: Ambulatory Visit | Attending: Urology

## 2015-12-21 ENCOUNTER — Encounter: Payer: Self-pay | Admitting: Anesthesiology

## 2015-12-21 ENCOUNTER — Ambulatory Visit
Admission: RE | Admit: 2015-12-21 | Discharge: 2015-12-21 | Disposition: A | Discharge status: Home / Self Care | Payer: Medicare Other | Source: Ambulatory Visit | Attending: Urology | Admitting: Urology

## 2015-12-21 DIAGNOSIS — C671 Malignant neoplasm of dome of bladder: Secondary | ICD-10-CM | POA: Insufficient documentation

## 2015-12-21 DIAGNOSIS — F17210 Nicotine dependence, cigarettes, uncomplicated: Secondary | ICD-10-CM | POA: Insufficient documentation

## 2015-12-21 DIAGNOSIS — D494 Neoplasm of unspecified behavior of bladder: Secondary | ICD-10-CM

## 2015-12-21 DIAGNOSIS — I10 Essential (primary) hypertension: Secondary | ICD-10-CM | POA: Insufficient documentation

## 2015-12-21 DIAGNOSIS — J449 Chronic obstructive pulmonary disease, unspecified: Secondary | ICD-10-CM | POA: Diagnosis not present

## 2015-12-21 DIAGNOSIS — M1990 Unspecified osteoarthritis, unspecified site: Secondary | ICD-10-CM | POA: Insufficient documentation

## 2015-12-21 DIAGNOSIS — C678 Malignant neoplasm of overlapping sites of bladder: Secondary | ICD-10-CM | POA: Diagnosis not present

## 2015-12-21 HISTORY — PX: TRANSURETHRAL RESECTION OF BLADDER TUMOR WITH MITOMYCIN-C: SHX6459

## 2015-12-21 SURGERY — TRANSURETHRAL RESECTION OF BLADDER TUMOR WITH MITOMYCIN-C
Anesthesia: General | Site: Bladder | Wound class: Clean Contaminated

## 2015-12-21 MED ORDER — SODIUM CHLORIDE 0.9 % IJ SOLN
INTRAMUSCULAR | Status: AC
  Administered 2015-12-21: 10 mL via INTRAVENOUS
  Filled 2015-12-21: qty 10

## 2015-12-21 MED ORDER — ONDANSETRON HCL 4 MG/2ML IJ SOLN
4.0000 mg | Freq: Once | INTRAMUSCULAR | Status: AC | PRN
  Administered 2015-12-21: 4 mg via INTRAVENOUS

## 2015-12-21 MED ORDER — FAMOTIDINE 20 MG PO TABS
ORAL_TABLET | ORAL | Status: AC
  Administered 2015-12-21: 20 mg via ORAL
  Filled 2015-12-21: qty 1

## 2015-12-21 MED ORDER — ONDANSETRON HCL 4 MG/2ML IJ SOLN
INTRAMUSCULAR | Status: DC | PRN
  Administered 2015-12-21: 4 mg via INTRAVENOUS

## 2015-12-21 MED ORDER — FENTANYL CITRATE (PF) 100 MCG/2ML IJ SOLN
INTRAMUSCULAR | Status: DC | PRN
  Administered 2015-12-21: 75 ug via INTRAVENOUS
  Administered 2015-12-21: 25 ug via INTRAVENOUS

## 2015-12-21 MED ORDER — HYDROCODONE-ACETAMINOPHEN 5-325 MG PO TABS: 1.0000 | ORAL_TABLET | Freq: Four times a day (QID) | ORAL | 0 refills | Status: AC | PRN

## 2015-12-21 MED ORDER — DOCUSATE SODIUM 100 MG PO CAPS: 100.0000 mg | ORAL_CAPSULE | Freq: Two times a day (BID) | ORAL | 0 refills | Status: AC

## 2015-12-21 MED ORDER — LIDOCAINE HCL (CARDIAC) 20 MG/ML IV SOLN
INTRAVENOUS | Status: DC | PRN
  Administered 2015-12-21: 60 mg via INTRAVENOUS

## 2015-12-21 MED ORDER — CIPROFLOXACIN IN D5W 400 MG/200ML IV SOLN
INTRAVENOUS | Status: AC
  Administered 2015-12-21: 400 mg via INTRAVENOUS
  Filled 2015-12-21: qty 200

## 2015-12-21 MED ORDER — FAMOTIDINE 20 MG PO TABS
20.0000 mg | ORAL_TABLET | Freq: Once | ORAL | Status: AC
  Administered 2015-12-21: 20 mg via ORAL

## 2015-12-21 MED ORDER — PROMETHAZINE HCL 25 MG/ML IJ SOLN
INTRAMUSCULAR | Status: AC
  Administered 2015-12-21: 6.25 mg via INTRAVENOUS
  Filled 2015-12-21: qty 1

## 2015-12-21 MED ORDER — EPHEDRINE SULFATE 50 MG/ML IJ SOLN
INTRAMUSCULAR | Status: DC | PRN
  Administered 2015-12-21 (×2): 10 mg via INTRAVENOUS
  Administered 2015-12-21: 5 mg via INTRAVENOUS

## 2015-12-21 MED ORDER — FENTANYL CITRATE (PF) 100 MCG/2ML IJ SOLN
25.0000 ug | INTRAMUSCULAR | Status: AC | PRN
  Administered 2015-12-21 (×6): 25 ug via INTRAVENOUS

## 2015-12-21 MED ORDER — MITOMYCIN CHEMO FOR BLADDER INSTILLATION 40 MG
INTRAVENOUS | Status: DC | PRN
  Administered 2015-12-21: 40 mg via INTRAVESICAL

## 2015-12-21 MED ORDER — PROPOFOL 10 MG/ML IV BOLUS
INTRAVENOUS | Status: DC | PRN
Start: 2015-12-21 — End: 2015-12-21
  Administered 2015-12-21: 60 mg via INTRAVENOUS
  Administered 2015-12-21: 80 mg via INTRAVENOUS
  Administered 2015-12-21: 60 mg via INTRAVENOUS

## 2015-12-21 MED ORDER — FENTANYL CITRATE (PF) 100 MCG/2ML IJ SOLN
INTRAMUSCULAR | Status: AC
  Administered 2015-12-21: 25 ug via INTRAVENOUS
  Filled 2015-12-21: qty 2

## 2015-12-21 MED ORDER — MITOMYCIN CHEMO FOR BLADDER INSTILLATION 40 MG
40.0000 mg | Freq: Once | INTRAVENOUS | Status: DC
  Filled 2015-12-21: qty 40

## 2015-12-21 MED ORDER — ONDANSETRON HCL 4 MG/2ML IJ SOLN
INTRAMUSCULAR | Status: AC
  Filled 2015-12-21: qty 2

## 2015-12-21 MED ORDER — PHENYLEPHRINE HCL 10 MG/ML IJ SOLN
INTRAMUSCULAR | Status: DC | PRN
  Administered 2015-12-21 (×6): 100 ug via INTRAVENOUS
  Administered 2015-12-21: 200 ug via INTRAVENOUS

## 2015-12-21 MED ORDER — LACTATED RINGERS IV SOLN
INTRAVENOUS | Status: DC
  Administered 2015-12-21 (×3): via INTRAVENOUS

## 2015-12-21 MED ORDER — CIPROFLOXACIN IN D5W 400 MG/200ML IV SOLN
400.0000 mg | INTRAVENOUS | Status: AC
  Administered 2015-12-21: 400 mg via INTRAVENOUS

## 2015-12-21 MED ORDER — BELLADONNA ALKALOIDS-OPIUM 16.2-60 MG RE SUPP
RECTAL | Status: AC
  Administered 2015-12-21: 1 via RECTAL
  Filled 2015-12-21: qty 1

## 2015-12-21 MED ORDER — PROMETHAZINE HCL 25 MG/ML IJ SOLN
6.2500 mg | Freq: Once | INTRAMUSCULAR | Status: AC
  Administered 2015-12-21: 6.25 mg via INTRAVENOUS
  Filled 2015-12-21: qty 1

## 2015-12-21 MED ORDER — BELLADONNA ALKALOIDS-OPIUM 16.2-60 MG RE SUPP
1.0000 | Freq: Every day | RECTAL | Status: DC
  Administered 2015-12-21: 1 via RECTAL

## 2015-12-21 SURGICAL SUPPLY — 27 items
BAG DRAIN CYSTO-URO LG1000N (MISCELLANEOUS) ×2 IMPLANT
BAG URO DRAIN 2000ML W/SPOUT (MISCELLANEOUS) ×2 IMPLANT
CATH FOL 2WAY LX 20X30 (CATHETERS) ×2 IMPLANT
CATH FOLEY 2WAY  5CC 16FR (CATHETERS)
CATH URTH 16FR FL 2W BLN LF (CATHETERS) IMPLANT
CORD URO TURP 10FT (MISCELLANEOUS) ×2 IMPLANT
DRAPE UTILITY 15X26 TOWEL STRL (DRAPES) ×2 IMPLANT
DRESSING TELFA 4X3 1S ST N-ADH (GAUZE/BANDAGES/DRESSINGS) ×2 IMPLANT
ELECT LOOP 22F BIPOLAR SML (ELECTROSURGICAL) ×2
ELECT REM PT RETURN 9FT ADLT (ELECTROSURGICAL)
ELECTRODE LOOP 22F BIPOLAR SML (ELECTROSURGICAL) ×1 IMPLANT
ELECTRODE REM PT RTRN 9FT ADLT (ELECTROSURGICAL) IMPLANT
GLOVE BIO SURGEON STRL SZ 6.5 (GLOVE) ×4 IMPLANT
GOWN STRL REUS W/ TWL LRG LVL3 (GOWN DISPOSABLE) ×2 IMPLANT
GOWN STRL REUS W/TWL LRG LVL3 (GOWN DISPOSABLE) ×2
KIT RM TURNOVER CYSTO AR (KITS) ×2 IMPLANT
LOOP CUT BIPOLAR 24F LRG (ELECTROSURGICAL) IMPLANT
NDL SAFETY ECLIPSE 18X1.5 (NEEDLE) ×1 IMPLANT
NEEDLE HYPO 18GX1.5 SHARP (NEEDLE) ×1
PACK CYSTO AR (MISCELLANEOUS) ×2 IMPLANT
PLUG CATH AND CAP STER (CATHETERS) IMPLANT
PREP PVP WINGED SPONGE (MISCELLANEOUS) IMPLANT
SET IRRIG Y TYPE TUR BLADDER L (SET/KITS/TRAYS/PACK) ×2 IMPLANT
SET IRRIGATING DISP (SET/KITS/TRAYS/PACK) ×2 IMPLANT
SURGILUBE 2OZ TUBE FLIPTOP (MISCELLANEOUS) ×2 IMPLANT
SYRINGE IRR TOOMEY STRL 70CC (SYRINGE) ×2 IMPLANT
WATER STERILE IRR 1000ML POUR (IV SOLUTION) ×2 IMPLANT

## 2015-12-21 NOTE — OR Nursing (Signed)
At 1245 pt c/o nausea and dry heaves, Zofran 4mg  IVP given.  At 1321, pt still c/o nausea with dry heaves, Dr. Marcello Moores called, received order for Phenergan 6.25 mg diluted with 10 ml of normal saline give slowly over 5 minutes.

## 2015-12-21 NOTE — Anesthesia Preprocedure Evaluation (Addendum)
Anesthesia Evaluation  Patient identified by MRN, date of birth, ID band Patient awake    Reviewed: Allergy & Precautions, NPO status , Patient's Chart, lab work & pertinent test results, reviewed documented beta blocker date and time   Airway Mallampati: II  TM Distance: >3 FB     Dental  (+) Chipped, Upper Dentures, Poor Dentition, Dental Advisory Given, Missing   Pulmonary shortness of breath, COPD,  COPD inhaler, Current Smoker,           Cardiovascular hypertension, Pt. on medications + Peripheral Vascular Disease       Neuro/Psych    GI/Hepatic   Endo/Other    Renal/GU      Musculoskeletal  (+) Arthritis ,   Abdominal   Peds  Hematology   Anesthesia Other Findings Gout. Occasionally uses O2 at nite. But not for the last several days. Took albuterol this morning.  Reproductive/Obstetrics                            Anesthesia Physical Anesthesia Plan  ASA: III  Anesthesia Plan: General   Post-op Pain Management:    Induction: Intravenous  Airway Management Planned: Oral ETT and LMA  Additional Equipment:   Intra-op Plan:   Post-operative Plan:   Informed Consent: I have reviewed the patients History and Physical, chart, labs and discussed the procedure including the risks, benefits and alternatives for the proposed anesthesia with the patient or authorized representative who has indicated his/her understanding and acceptance.     Plan Discussed with: CRNA  Anesthesia Plan Comments:         Anesthesia Quick Evaluation

## 2015-12-21 NOTE — Transfer of Care (Signed)
Immediate Anesthesia Transfer of Care Note  Patient: Nathaniel Armstrong  Procedure(s) Performed: Procedure(s): TRANSURETHRAL RESECTION OF BLADDER TUMOR WITH MITOMYCIN-C (N/A)  Patient Location: PACU  Anesthesia Type:General  Level of Consciousness: awake  Airway & Oxygen Therapy: Patient Spontanous Breathing  Post-op Assessment: Report given to RN  Post vital signs: stable  Last Vitals:  Vitals:   12/21/15 0932 12/21/15 1115  BP: (!) 163/54 (!) 123/92  Pulse: 98 84  Resp: 16 (!) 9  Temp: 36.6 C (!) 36.1 C    Last Pain:  Vitals:   12/21/15 1115  TempSrc:   PainSc: Asleep         Complications: No apparent anesthesia complications

## 2015-12-21 NOTE — OR Nursing (Signed)
Foley to straight drain, pink tinged urine in foley bag.

## 2015-12-21 NOTE — Anesthesia Procedure Notes (Signed)
Procedure Name: LMA Insertion Date/Time: 12/21/2015 9:58 AM Performed by: Leander Rams Pre-anesthesia Checklist: Patient identified, Emergency Drugs available, Suction available, Patient being monitored and Timeout performed Patient Re-evaluated:Patient Re-evaluated prior to inductionOxygen Delivery Method: Circle system utilized Preoxygenation: Pre-oxygenation with 100% oxygen Intubation Type: IV induction LMA: LMA inserted LMA Size: 3.0 Number of attempts: 1

## 2015-12-21 NOTE — H&P (View-Only) (Signed)
12/15/2015 3:33 PM   Nathaniel Armstrong 01-Oct-1932 [id number]  Referring provider: [facility] [place], [place] [address]  Chief Complaint  Patient presents with  . Cysto    HPI: 82 year old male who presents today for further evaluation of gross hematuria. He was seen and evaluated by Zara Council on 10/27/2015 at which time he experienced episodes of painless gross hematuria.  He's had no further episodes of bleeding since his initial evaluation.  As part of his further workup, he underwent CT urogram which shows a broad-based filling defect arising from the right lateral wall of the bladder measuring 1.3 cm highly suspicious for urothelial carcinoma. No other upper tract filling defects appreciated.  No evidence of pelvic lymphadenopathy or disease outside of the bladder.  He is a current smoker.  He was previously seen and evaluated in our office for an elevated PSA of 5.4 which rose to a 8.4 and is being followed clinically based his age with q6 month PSA.    PMH: Past Medical History:  Diagnosis Date  . COPD (chronic obstructive pulmonary disease) ([place])   . Gout   . Hypertension     Surgical History: Past Surgical History:  Procedure Laterality Date  . HAND SURGERY  1978    Home Medications:    Medication List       Accurate as of 12/15/15  3:33 PM. Always use your most recent med list.          albuterol 108 (90 Base) MCG/ACT inhaler Commonly known as:  PROVENTIL HFA;VENTOLIN HFA Inhale 2 puffs into the lungs every 6 (six) hours as needed for wheezing or shortness of breath.   allopurinol 100 MG tablet Commonly known as:  ZYLOPRIM Take 100 mg by mouth 2 (two) times daily.   amLODipine 10 MG tablet Commonly known as:  NORVASC Take 10 mg by mouth daily.   finasteride 5 MG tablet Commonly known as:  PROSCAR Take 1 tablet (5 mg total) by mouth daily.   losartan 50 MG tablet Commonly known as:  COZAAR Take 50 mg by  mouth daily.   methotrexate 2.5 MG tablet Commonly known as:  RHEUMATREX Take by mouth.   Vitamin D3 1000 units Caps Take 1 capsule by mouth daily.       Allergies:  Allergies  Allergen Reactions  . Penicillins Other (See Comments)    Family History: Family History  Problem Relation Age of Onset  . Hypertension Mother   . Hypertension Father   . Stroke Sister   . Hypertension Sister   . Prostate cancer Neg Hx   . Kidney cancer Neg Hx     Social History:  reports that he has been smoking Cigarettes.  He has a 36.00 pack-year smoking history. He has never used smokeless tobacco. He reports that he does not drink alcohol or use drugs.   Physical Exam: BP (!) 152/79   Pulse (!) 101   Ht 5\' 7"  (1.702 m)   Wt 113 lb (51.3 kg)   BMI 17.70 kg/m   Constitutional:  Alert and oriented, No acute distress. HEENT: [place] AT, moist mucus membranes.  Trachea midline, no masses. Cardiovascular: No clubbing, cyanosis, or edema. RRR. Respiratory: Normal respiratory effort, no increased work of breathing. CTAB. GI: Abdomen is soft, nontender, nondistended.  Large reducible umbilical hernia appreciated. GU: No CVA tenderness. Normal circumcised phallus. Skin: No rashes, bruises or suspicious lesions. Neurologic: Grossly intact, no focal deficits, moving all 4 extremities. Psychiatric: Normal  mood and affect.  Laboratory Data: Lab Results  Component Value Date   WBC 5.6 10/17/2015   HGB 13.3 10/17/2015   HCT 39.8 (L) 10/17/2015   MCV 89.6 10/17/2015   PLT 187 10/17/2015    Lab Results  Component Value Date   CREATININE 0.94 10/27/2015    Urinalysis UA reviewed, no evidence of active infection  Pertinent Imaging: CLINICAL DATA:  Painless hematuria  EXAM: CT ABDOMEN AND PELVIS WITHOUT AND WITH CONTRAST  TECHNIQUE: Multidetector CT imaging of the abdomen and pelvis was performed following the standard protocol before and following the bolus administration of  intravenous contrast.  CONTRAST:  161mL ISOVUE-300 IOPAMIDOL (ISOVUE-300) INJECTION 61%  COMPARISON:  03/18/2014  FINDINGS: Lower chest: Scar noted in the right lower lobe. No pleural or pericardial effusion. Diffuse bronchial wall thickening identified and there are changes suggestive of emphysema.  Hepatobiliary: The liver appears normal. The gallbladder is unremarkable. No biliary dilatation.  Pancreas: No inflammation or mass identified.  Spleen: The spleen is unremarkable.  Adrenals/Urinary Tract: The adrenal glands are both normal. No kidney stones identified. There is no hydronephrosis or hydroureter. The urinary bladder is unremarkable. There are small bilateral renal cysts identified. On the delayed images there is symmetric opacification of the collecting systems. There is a broad-based, enhancing filling defect arising from the right lateral bladder wall measuring 1.3 cm, image 58 of series 12.  Stomach/Bowel: The stomach is within normal limits. The small bowel loops have a normal course and caliber. No obstruction. Normal appearance of the colon.  Vascular/Lymphatic: Aortic atherosclerosis is identified. The aorta measures 2.3 cm in AP dimension. 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  Reproductive: Prostate gland is enlarged.  Other: There is no ascites or focal fluid collections within the abdomen or pelvis.  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 Multi level degenerative disc disease is noted throughout the lumbar spine. No suspicious lytic or sclerotic bone lesions identified.  IMPRESSION: 1. Examination is positive for enhancing filling defect within the right lateral bladder wall compatible with urothelial carcinoma. Correlation with direct visualization and tissue sampling advise. 2. Kidney cysts. 3. 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 Left hip AVN.   Electronically Signed   By: Kerby Moors M.D.   On: 11/11/2015 14:20  CT scan personally reviewed today.   Cystoscopy Procedure Note  Patient identification was confirmed, informed consent was obtained, and patient was prepped using Betadine solution.  Lidocaine jelly was administered per urethral meatus.    Preoperative abx where received prior to procedure.     Pre-Procedure: - Inspection reveals a normal caliber ureteral meatus.  Procedure: The flexible cystoscope was introduced without difficulty - No urethral strictures/lesions are present. - Enlarged prostate with trilobar coapation - Elevated bladder neck (mild) - Bilateral ureteral orifices identified - Bladder mucosa reveals broad-based nodular tumor on dome of bladder, primarily the right dome measuring approximately 2 cm highly concerning for TCC - No bladder stones - Mild trabeculation   Post-Procedure: - Procedure was not well tolerated today therefore visualization was somewhat limited  Assessment & Plan:    1. Gross hematuria S/p work up including CT Urogram and cystoscopy revealing bladder tumor concerning for TCC, see below - Urinalysis, Complete - ciprofloxacin (CIPRO) tablet 500 mg; Take 1 tablet (500 mg total) by mouth once. - lidocaine (XYLOCAINE) 2 % jelly 1 application; Place 1 application into the urethra  once. - CULTURE, URINE COMPREHENSIVE  2. 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 Risks and benefits were reviewed today in detail. All of his questions along with his wife and daughters were answered. Risk of bleeding, damage to surrounding structures, bladder irritation, infection, bladder  perforation were discussed in detail. Possible need for Foley catheter was also discussed. She would like to proceed as planned.  Preop UCx  3. Tobacco abuse Discussed causal relationship between smoking and bladder cancer. Patient is unwilling to stop at this time.  4. Elevated PSA History of elevated PSA, based on his age, we'll continue to follow conservatively due for PSA at 6 month interval as previously discussed  Schedule TURBT, instillation of mitomycin  Hollice Espy, MD  [facility] [address] [place], [place] [address] [phone]

## 2015-12-21 NOTE — Discharge Instructions (Signed)
AMBULATORY SURGERY  DISCHARGE INSTRUCTIONS   1) The drugs that you were given will stay in your system until tomorrow so for the next 24 hours you should not:  A) Drive an automobile B) Make any legal decisions C) Drink any alcoholic beverage   2) You may resume regular meals tomorrow.  Today it is better to start with liquids and gradually work up to solid foods.  You may eat anything you prefer, but it is better to start with liquids, then soup and crackers, and gradually work up to solid foods.   3) Please notify your doctor immediately if you have any unusual bleeding, trouble breathing, redness and pain at the surgery site, drainage, fever, or pain not relieved by medication.    4) Additional Instructions:        Please contact your physician with any problems or Same Day Surgery at [phone], Monday through Friday 6 am to 4 pm, or [place] at [facility] number at [phone].   Transurethral Resection of Bladder Tumor (TURBT) or Bladder Biopsy   Definition:  Transurethral Resection of the Bladder Tumor is a surgical procedure used to diagnose and remove tumors within the bladder. TURBT is the most common treatment for early stage bladder cancer.  General instructions:     Your recent bladder surgery requires very little post hospital care but some definite precautions.  Despite the fact that no skin incisions were used, the area around the bladder incisions are raw and covered with scabs to promote healing and prevent bleeding. Certain precautions are needed to insure that the scabs are not disturbed over the next 2-4 weeks while the healing proceeds.  Because the raw surface inside your bladder and the irritating effects of urine you may expect frequency of urination and/or urgency (a stronger desire to urinate) and perhaps even getting up at night more often. This will usually resolve or improve slowly over the healing period. You may see some blood in  your urine over the first 6 weeks. Do not be alarmed, even if the urine was clear for a while. Get off your feet and drink lots of fluids until clearing occurs. If you start to pass clots or don't improve call us.  Diet:  You may return to your normal diet immediately. Because of the raw surface of your bladder, alcohol, spicy foods, foods high in acid and drinks with caffeine may cause irritation or frequency and should be used in moderation. To keep your urine flowing freely and avoid constipation, drink plenty of fluids during the day (8-10 glasses). Tip: Avoid cranberry juice because it is very acidic.  Activity:  Your physical activity doesn't need to be restricted. However, if you are very active, you may see some blood in the urine. We suggest that you reduce your activity under the circumstances until the bleeding has stopped.  Bowels:  It is important to keep your bowels regular during the postoperative period. Straining with bowel movements can cause bleeding. A bowel movement every other day is reasonable. Use a mild laxative if needed, such as milk of magnesia 2-3 tablespoons, or 2 Dulcolax tablets. Call if you continue to have problems. If you had been taking narcotics for pain, before, during or after your surgery, you may be constipated. Take a laxative if necessary.    Medication:  You should resume your pre-surgery medications unless told not to. In addition you may be given an antibiotic to prevent or treat infection. Antibiotics are not always necessary.  All medication should be taken as prescribed until the bottles are finished unless you are having an unusual reaction to one of the drugs.   [facility] [address] [place], [place] [address] [phone]     Foley Catheter Care, Adult A Foley catheter is a soft, flexible tube. This tube is placed into your bladder to drain pee (urine). If you go home with this catheter in place,  follow the instructions below. TAKING CARE OF THE CATHETER 1. Wash your hands with soap and water. 2. Put soap and water on a clean washcloth.  Clean the skin where the tube goes into your body.  Clean away from the tube site.  Never wipe toward the tube.  Clean the area using a circular motion.  Remove all the soap. Pat the area dry with a clean towel. For males, reposition the skin that covers the end of the penis (foreskin). 3. Attach the tube to your leg with tape or a leg strap. Do not stretch the tube tight. If you are using tape, remove any stickiness left behind by past tape you used. 4. Keep the drainage bag below your hips. Keep it off the floor. 5. Check your tube during the day. Make sure it is working and draining. Make sure the tube does not curl, twist, or bend. 6. Do not pull on the tube or try to take it out. TAKING CARE OF THE DRAINAGE BAGS You will have a large overnight drainage bag and a small leg bag. You may wear the overnight bag any time. Never wear the small bag at night. Follow the directions below. Emptying the Drainage Bag Empty your drainage bag when it is  - full or at least 2-3 times a day. 1. Wash your hands with soap and water. 2. Keep the drainage bag below your hips. 3. Hold the dirty bag over the toilet or clean container. 4. Open the pour spout at the bottom of the bag. Empty the pee into the toilet or container. Do not let the pour spout touch anything. 5. Clean the pour spout with a gauze pad or cotton ball that has rubbing alcohol on it. 6. Close the pour spout. 7. Attach the bag to your leg with tape or a leg strap. 8. Wash your hands well. Changing the Drainage Bag Change your bag once a month or sooner if it starts to smell or look dirty.  1. Wash your hands with soap and water. 2. Pinch the rubber tube so that pee does not spill out. 3. Disconnect the catheter tube from the drainage tube at the connection valve. Do not let the tubes  touch anything. 4. Clean the end of the catheter tube with an alcohol wipe. Clean the end of a the drainage tube with a different alcohol wipe. 5. Connect the catheter tube to the drainage tube of the clean drainage bag. 6. Attach the new bag to the leg with tape or a leg strap. Avoid attaching the new bag too tightly. 7. Wash your hands well. Cleaning the Drainage Bag 1. Wash your hands with soap and water. 2. Wash the bag in warm, soapy water. 3. Rinse the bag with warm water. 4. Fill the bag with a mixture of white vinegar and water (1 cup vinegar to 1 quart warm water [.2 liter vinegar to 1 liter warm water]). Close the bag and soak it for 30 minutes in the solution. 5. Rinse the bag with warm water. 6.  Hang the bag to dry with the pour spout open and hanging downward. 7. Store the clean bag (once it is dry) in a clean plastic bag. 8. Wash your hands well. PREVENT INFECTION  Wash your hands before and after touching your tube.  Take showers every day. Wash the skin where the tube enters your body. Do not take baths. Replace wet leg straps with dry ones, if this applies.  Do not use powders, sprays, or lotions on the genital area. Only use creams, lotions, or ointments as told by your doctor.  For females, wipe from front to back after going to the bathroom.  Drink enough fluids to keep your pee clear or pale yellow unless you are told not to have too much fluid (fluid restriction).  Do not let the drainage bag or tubing touch or lie on the floor.  Wear cotton underwear to keep the area dry. GET HELP IF:  Your pee is cloudy or smells unusually bad.  Your tube becomes clogged.  You are not draining pee into the bag or your bladder feels full.  Your tube starts to leak. GET HELP RIGHT AWAY IF:  You have pain, puffiness (swelling), redness, or yellowish-white fluid (pus) where the tube enters the body.  You have pain in the belly (abdomen), legs, lower back, or  bladder.  You have a fever.  You see blood fill the tube, or your pee is pink or red.  You feel sick to your stomach (nauseous), throw up (vomit), or have chills.  Your tube gets pulled out. MAKE SURE YOU:   Understand these instructions.  Will watch your condition.  Will get help right away if you are not doing well or get worse.   This information is not intended to replace advice given to you by your health care provider. Make sure you discuss any questions you have with your health care provider.   Document Released: 07/02/2012 Document Revised: 03/28/2014 Document Reviewed: 07/02/2012 Elsevier Interactive Patient Education [employer].

## 2015-12-21 NOTE — Op Note (Signed)
Date of procedure: 12/21/15  Preoperative diagnosis:  1. Bladder tumor  2. Gross hematuria  Postoperative diagnosis:  1. Same as above   Procedure: 1. TURBT (med) 2. Instillation of intravesical mitomycin  Surgeon: Hollice Espy, MD  Anesthesia: General  Complications: None  Intraoperative findings: 1.3 cm nodular tumor at the right dome of the bladder with adjacent superficial appearing papillary tumor measuring a total of 3 x 3 cm  EBL: minimal  Specimens: Bladder tumor, nodular tumor deep base, papillary tumor  Drains: 20 Fr 2 way Foley  Indication: Nathaniel Armstrong is a 82 y.o. patient with gross hematuria who underwent further workup with CT urogram showing a 1.3 cm nodular tumor. Cystoscopy in the office revealed broad-based nodular tumor at the dome with adjacent papillary tumor.  After reviewing the management options for treatment, he elected to proceed with the above surgical procedure(s). We have discussed the potential benefits and risks of the procedure, side effects of the proposed treatment, the likelihood of the patient achieving the goals of the procedure, and any potential problems that might occur during the procedure or recuperation. Informed consent has been obtained.  Description of procedure:  The patient was taken to the operating room and general anesthesia was induced.  The patient was placed in the dorsal lithotomy position, prepped and draped in the usual sterile fashion, and preoperative antibiotics were administered. A preoperative time-out was performed.   A 26 French resectoscope was introduced per urethra using blunt nonvisual obturator. The scope was then brought in and careful cystoscopy was performed. This revealed an elevated bladder neck, mildly trabeculated bladder with an oval-shaped bladder tumor nodular and confirmation at the right dome of the bladder measuring approximately 1.3 cm. Adjacent to this, there was diffuse amounts of superficial  appearing papillary tumor with a total measurement of 3 cm x 3 cm in surface area. There is no other tumor identified in any other location within the bladder.  A bipolar resectoscope loop was then used to carefully take down the bladder tumor nodule. It was completely solid in nature. The tumor was taken down to the muscle layer.  Once this nodule was resected, a cup biopsies were brought in and additional biopsies of the tumor base of the nodule was biopsied and labeled as deep nodular base. Several biopsies of the more superficial adjacent tumor were also biopsied and passed off the field as papillary biopsy.  Some additional superficial tumor was then resected using the loop and the remainder of the tumor was fulgurated carefully using the electrocautery loop. Care was taken to ensure that all of the tumor was resected. Once there was no residual tumor appreciated, careful hemostasis was achieved. All bladder tumor chips were evacuated from the bladder and passed off the field as bladder tumor. The bladder was drained and the scope was removed. A 20 French two-way Foley catheter was then placed. The patient was cleaned and dried and taken to the PACU in stable condition after being reversed from anesthesia.  40 mg of intravesical mitomycin was instilled into the bladder and allowed to dwell in the PACU for a total of 1 hour. Once complete, the bladder was drained and the catheter was left in place.   Plan: Patient will follow-up in 3 days for Foley catheter removal. He'll return to the office in approximate one week for pathology review.    Hollice Espy, M.D.

## 2015-12-21 NOTE — Interval H&P Note (Signed)
History and Physical Interval Note:  12/21/2015 9:27 AM  Nathaniel Armstrong  has presented today for surgery, with the diagnosis of BLADDER TUMOR  The various methods of treatment have been discussed with the patient and family. After consideration of risks, benefits and other options for treatment, the patient has consented to  Procedure(s): TRANSURETHRAL RESECTION OF BLADDER TUMOR WITH MITOMYCIN-C (N/A) as a surgical intervention .  The patient's history has been reviewed, patient examined, no change in status, stable for surgery.  I have reviewed the patient's chart and labs.  Questions were answered to the patient's satisfaction.     Hollice Espy

## 2015-12-21 NOTE — Anesthesia Postprocedure Evaluation (Signed)
Anesthesia Post Note  Patient: Zyien Dieken  Procedure(s) Performed: Procedure(s) (LRB): TRANSURETHRAL RESECTION OF BLADDER TUMOR WITH MITOMYCIN-C (N/A)  Patient location during evaluation: PACU Anesthesia Type: General Level of consciousness: awake and alert Pain management: pain level controlled Vital Signs Assessment: post-procedure vital signs reviewed and stable Respiratory status: spontaneous breathing, nonlabored ventilation, respiratory function stable and patient connected to nasal cannula oxygen Cardiovascular status: blood pressure returned to baseline and stable Postop Assessment: no signs of nausea or vomiting Anesthetic complications: no    Last Vitals:  Vitals:   12/21/15 1225 12/21/15 1422  BP: 122/61 (!) 110/54  Pulse: 74 74  Resp: 16 16  Temp: 36.2 C     Last Pain:  Vitals:   12/21/15 1422  TempSrc:   PainSc: 0-No pain                 Dannie Hattabaugh S

## 2015-12-22 ENCOUNTER — Encounter: Payer: Self-pay | Admitting: Urology

## 2015-12-22 LAB — SURGICAL PATHOLOGY

## 2015-12-24 ENCOUNTER — Ambulatory Visit: Payer: Medicare Other

## 2015-12-24 ENCOUNTER — Encounter: Payer: Self-pay | Admitting: Urology

## 2015-12-24 DIAGNOSIS — C671 Malignant neoplasm of dome of bladder: Secondary | ICD-10-CM

## 2015-12-24 NOTE — Progress Notes (Signed)
Catheter Removal  Patient is present today for a catheter removal.  74ml of water was drained from the balloon. A 20FR foley cath was removed from the bladder no complications were noted . Patient tolerated well.  Preformed by: Toniann Fail, LPN   Follow up/ Additional notes:  Reinforced with pt and family for pt to drink plenty of fluid today and if not able to urinate to RTC. Family voiced understanding.

## 2015-12-29 ENCOUNTER — Encounter: Payer: Self-pay | Admitting: Urology

## 2015-12-29 ENCOUNTER — Ambulatory Visit (INDEPENDENT_AMBULATORY_CARE_PROVIDER_SITE_OTHER): Payer: Medicare Other | Admitting: Urology

## 2015-12-29 VITALS — BP 146/70 | HR 86 | Ht 67.0 in | Wt 130.0 lb

## 2015-12-29 DIAGNOSIS — C672 Malignant neoplasm of lateral wall of bladder: Secondary | ICD-10-CM | POA: Diagnosis not present

## 2015-12-29 DIAGNOSIS — R911 Solitary pulmonary nodule: Secondary | ICD-10-CM | POA: Diagnosis not present

## 2015-12-29 NOTE — Progress Notes (Signed)
12/29/2015 9:11 PM   Nathaniel Armstrong June 14, 1932 [id number]  Referring provider: [facility] Nathaniel Armstrong, [place] [address]  Chief Complaint  Patient presents with  . Routine Post Op    path results    HPI: 82 year old male who initially presented with painless gross hematuria found to have a nodular tumor arising from the right lateral bladder with adjacent papillary tumor. CT urogram was otherwise negative for any upper tract filling defects or evidence of metastatic disease. He is taken to the operating room on 12/21/2015 for TURBT, instillation of mitomycin.  He returns to the office today with his daughter to discuss pathology results.  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  He is a current and ongoing smoker. He does have multiple medical comorbidities including peripheral artery disease, COPD amongst others. He is fairly deconditioned and presents today in a wheelchair.  He also has a history of rising PSA which is being followed conservatively.  He was previously seen and evaluated in our office for an elevated PSA of 5.4 which rose to a 8.4 (7/17) and is being followed clinically based his age with q6 month PSA.   Nathaniel Armstrong is known to the [facility] in [place] previously followed for pulmonary nodules.  Today, he complains of ongoing dysuria since surgery.  No fevers, chills, or gross hematuria.    PMH: Past Medical History:  Diagnosis Date  . Arthritis   . COPD (chronic obstructive pulmonary disease) ([place])   . Enlarged prostate   . Gout   . Hypertension   . Peripheral vascular disease ([place])   . Shortness of breath dyspnea     Surgical History: Past Surgical History:  Procedure Laterality Date  . CATARACT EXTRACTION W/ INTRAOCULAR LENS  IMPLANT, BILATERAL Bilateral   . HAND SURGERY  1978  . TRANSURETHRAL RESECTION OF  BLADDER TUMOR WITH MITOMYCIN-C N/A 12/21/2015   Procedure: TRANSURETHRAL RESECTION OF BLADDER TUMOR WITH MITOMYCIN-C;  Surgeon: Hollice Espy, MD;  Location: [facility] ORS;  Service: Urology;  Laterality: N/A;    Home Medications:    Medication List       Accurate as of 12/29/15  9:11 PM. Always use your most recent med list.          albuterol 108 (90 Base) MCG/ACT inhaler Commonly known as:  PROVENTIL HFA;VENTOLIN HFA Inhale 2 puffs into the lungs every 6 (six) hours as needed for wheezing or shortness of breath.   allopurinol 100 MG tablet Commonly known as:  ZYLOPRIM Take 100 mg by mouth 2 (two) times daily.   amLODipine 10 MG tablet Commonly known as:  NORVASC Take 10 mg by mouth daily.   beclomethasone 40 MCG/ACT inhaler Commonly known as:  QVAR Inhale 1 puff into the lungs 2 (two) times daily.   colchicine 0.6 MG tablet Take 0.6 mg by mouth daily.   docusate sodium 100 MG capsule Commonly known as:  COLACE Take 1 capsule (100 mg total) by mouth 2 (two) times daily.   finasteride 5 MG tablet Commonly known as:  PROSCAR Take 1 tablet (5 mg total) by mouth daily.   HYDROcodone-acetaminophen 5-325 MG tablet Commonly known as:  NORCO/VICODIN Take 1-2 tablets by mouth every 6 (six) hours as needed for moderate pain.   losartan 50 MG tablet Commonly known as:  COZAAR Take 50 mg by mouth daily.   Vitamin D3 1000 units Caps Take 1 capsule by mouth  daily.       Allergies:  Allergies  Allergen Reactions  . Penicillins Rash and Other (See Comments)    Family History: Family History  Problem Relation Age of Onset  . Hypertension Mother   . Hypertension Father   . Stroke Sister   . Hypertension Sister   . Prostate cancer Neg Hx   . Kidney cancer Neg Hx     Social History:  reports that he has been smoking Cigarettes.  He has a 36.00 pack-year smoking history. He has never used smokeless tobacco. He reports that he does not drink alcohol or use  drugs.  ROS: UROLOGY Frequent Urination?: No Hard to postpone urination?: No Burning/pain with urination?: Yes Get up at night to urinate?: No Leakage of urine?: No Urine stream starts and stops?: No Trouble starting stream?: No Do you have to strain to urinate?: No Blood in urine?: No Urinary tract infection?: No Sexually transmitted disease?: No Injury to kidneys or bladder?: No Painful intercourse?: No Weak stream?: No Erection problems?: No Penile pain?: No  Gastrointestinal Nausea?: No Vomiting?: No Indigestion/heartburn?: No Diarrhea?: No Constipation?: Yes  Constitutional Fever: No Night sweats?: No Weight loss?: No Fatigue?: No  Skin Skin rash/lesions?: No Itching?: No  Eyes Blurred vision?: No Double vision?: No  Ears/Nose/Throat Sore throat?: No Sinus problems?: No  Hematologic/Lymphatic Swollen glands?: No Easy bruising?: No  Cardiovascular Leg swelling?: No Chest pain?: No  Respiratory Cough?: No Shortness of breath?: Yes  Endocrine Excessive thirst?: No  Musculoskeletal Back pain?: No Joint pain?: No  Neurological Headaches?: No Dizziness?: No  Psychologic Depression?: No Anxiety?: No  Physical Exam: BP (!) 146/70   Pulse 86   Ht 5\' 7"  (1.702 m)   Wt 130 lb (59 kg)   BMI 20.36 kg/m   Constitutional:  Alert and oriented, No acute distress. Thin.  Riding in wheel chair. Accompanied by daughter.   HEENT: [place] AT, moist mucus membranes.  Trachea midline, no masses. Cardiovascular: No clubbing, cyanosis, or edema. Respiratory: Normal respiratory effort, no increased work of breathing. Skin: No rashes, bruises or suspicious lesions. Neurologic: Grossly intact, no focal deficits, moving all 4 extremities. Psychiatric: Normal mood and affect.  Laboratory Data: Lab Results  Component Value Date   WBC 6.4 12/16/2015   HGB 12.5 (L) 12/16/2015   HCT 37.1 (L) 12/16/2015   MCV 90.0 12/16/2015   PLT 196 12/16/2015    Lab  Results  Component Value Date   CREATININE 0.90 12/16/2015    Urinalysis Not enough urine for UA today, UCx only sent  Pertinent Imaging: CT Urogram from 11/11/15 reviewed  Assessment & Plan:    1. Malignant neoplasm of lateral wall of urinary bladder [facility] [place] [facility]) Pathology reviewed, pT2 LgTCC Recommend further staging with chest CT   Today, options moving forward with discussed in detail. The conversation was length today Ideally, the patient would have neoadjuvant chemotherapy followed by cystectomy as primary therapy, but given the patient's overall medical comorbidities and functional status, I do not feel that he is a good candidate for this. Additionally, he and his daughter not interested in this procedure. Risks and benefits of cystectomy were discussed in detail.  As an alternative, we discussed trying modal therapy including local control with resection as needed for recurrence, radiation, and chemotherapy.  We'll refer patient to cancer Center including Dr. Mike Gip and Dr. Donella Stade to discuss each of these modalities.  - CT CHEST W CONTRAST; Future - Ambulatory referral to Radiation Oncology - Ambulatory referral to  Oncology - CULTURE, URINE COMPREHENSIVE  2. Lung nodule History of lung nodule, previously follow by Dr. Mike Gip   Return in about 4 weeks (around 01/26/2016) for recheck bladder cancer.  Hollice Espy, MD  [facility] [address], [place], [place] [address] [phone]  I spent 25 min with this patient of which greater than 50% was spent in counseling and coordination of care with the patient.  Case was also discussed at [facility] tumor board conference last week and pathology personally reviewed.

## 2015-12-30 NOTE — Progress Notes (Signed)
  Let's get him on the schedule.  Thanks,  M

## 2016-01-01 LAB — CULTURE, URINE COMPREHENSIVE

## 2016-01-04 ENCOUNTER — Ambulatory Visit
Admission: RE | Admit: 2016-01-04 | Discharge: 2016-01-04 | Disposition: A | Discharge status: Home / Self Care | Payer: Medicare Other | Source: Ambulatory Visit | Attending: Urology | Admitting: Urology

## 2016-01-04 DIAGNOSIS — I700 Atherosclerosis of aorta: Secondary | ICD-10-CM | POA: Diagnosis not present

## 2016-01-04 DIAGNOSIS — R918 Other nonspecific abnormal finding of lung field: Secondary | ICD-10-CM | POA: Diagnosis not present

## 2016-01-04 DIAGNOSIS — J432 Centrilobular emphysema: Secondary | ICD-10-CM | POA: Insufficient documentation

## 2016-01-04 DIAGNOSIS — C672 Malignant neoplasm of lateral wall of bladder: Secondary | ICD-10-CM | POA: Diagnosis not present

## 2016-01-04 DIAGNOSIS — I2510 Atherosclerotic heart disease of native coronary artery without angina pectoris: Secondary | ICD-10-CM | POA: Diagnosis not present

## 2016-01-04 DIAGNOSIS — I771 Stricture of artery: Secondary | ICD-10-CM | POA: Insufficient documentation

## 2016-01-04 HISTORY — DX: Malignant neoplasm of bladder, unspecified: C67.9

## 2016-01-04 MED ORDER — IOPAMIDOL (ISOVUE-300) INJECTION 61%
75.0000 mL | Freq: Once | INTRAVENOUS | Status: AC | PRN
  Administered 2016-01-04: 75 mL via INTRAVENOUS

## 2016-01-06 ENCOUNTER — Other Ambulatory Visit: Payer: Self-pay | Admitting: Hematology and Oncology

## 2016-01-06 ENCOUNTER — Inpatient Hospital Stay: Payer: Medicare Other | Attending: Hematology and Oncology | Admitting: Hematology and Oncology

## 2016-01-06 ENCOUNTER — Encounter: Payer: Self-pay | Admitting: Radiation Oncology

## 2016-01-06 ENCOUNTER — Ambulatory Visit
Admission: RE | Admit: 2016-01-06 | Discharge: 2016-01-06 | Disposition: A | Discharge status: Home / Self Care | Payer: Medicare Other | Source: Ambulatory Visit | Attending: Radiation Oncology | Admitting: Radiation Oncology

## 2016-01-06 ENCOUNTER — Encounter: Payer: Self-pay | Admitting: *Deleted

## 2016-01-06 VITALS — BP 151/62 | HR 74 | Temp 98.0°F | Wt 130.1 lb

## 2016-01-06 DIAGNOSIS — M129 Arthropathy, unspecified: Secondary | ICD-10-CM | POA: Diagnosis not present

## 2016-01-06 DIAGNOSIS — J439 Emphysema, unspecified: Secondary | ICD-10-CM | POA: Diagnosis not present

## 2016-01-06 DIAGNOSIS — R3915 Urgency of urination: Secondary | ICD-10-CM | POA: Insufficient documentation

## 2016-01-06 DIAGNOSIS — M109 Gout, unspecified: Secondary | ICD-10-CM | POA: Insufficient documentation

## 2016-01-06 DIAGNOSIS — F17210 Nicotine dependence, cigarettes, uncomplicated: Secondary | ICD-10-CM | POA: Insufficient documentation

## 2016-01-06 DIAGNOSIS — R918 Other nonspecific abnormal finding of lung field: Secondary | ICD-10-CM

## 2016-01-06 DIAGNOSIS — R0602 Shortness of breath: Secondary | ICD-10-CM | POA: Insufficient documentation

## 2016-01-06 DIAGNOSIS — I10 Essential (primary) hypertension: Secondary | ICD-10-CM | POA: Insufficient documentation

## 2016-01-06 DIAGNOSIS — C672 Malignant neoplasm of lateral wall of bladder: Secondary | ICD-10-CM

## 2016-01-06 DIAGNOSIS — R5383 Other fatigue: Secondary | ICD-10-CM

## 2016-01-06 DIAGNOSIS — I739 Peripheral vascular disease, unspecified: Secondary | ICD-10-CM | POA: Insufficient documentation

## 2016-01-06 DIAGNOSIS — J449 Chronic obstructive pulmonary disease, unspecified: Secondary | ICD-10-CM | POA: Insufficient documentation

## 2016-01-06 DIAGNOSIS — Z79899 Other long term (current) drug therapy: Secondary | ICD-10-CM | POA: Diagnosis not present

## 2016-01-06 DIAGNOSIS — R972 Elevated prostate specific antigen [PSA]: Secondary | ICD-10-CM | POA: Diagnosis not present

## 2016-01-06 DIAGNOSIS — C679 Malignant neoplasm of bladder, unspecified: Secondary | ICD-10-CM | POA: Insufficient documentation

## 2016-01-06 DIAGNOSIS — R634 Abnormal weight loss: Secondary | ICD-10-CM | POA: Insufficient documentation

## 2016-01-06 DIAGNOSIS — N400 Enlarged prostate without lower urinary tract symptoms: Secondary | ICD-10-CM | POA: Insufficient documentation

## 2016-01-06 DIAGNOSIS — I493 Ventricular premature depolarization: Secondary | ICD-10-CM | POA: Diagnosis not present

## 2016-01-06 DIAGNOSIS — Z510 Encounter for antineoplastic radiation therapy: Secondary | ICD-10-CM | POA: Diagnosis not present

## 2016-01-06 NOTE — Progress Notes (Signed)
Patient on plan of care prior to pathways. 
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
## 2016-01-06 NOTE — Patient Instructions (Signed)
Carboplatin injection  What is this medicine?  CARBOPLATIN (KAR boe pla tin) is a chemotherapy drug. It targets fast dividing cells, like cancer cells, and causes these cells to die. This medicine is used to treat ovarian cancer and many other cancers.  This medicine may be used for other purposes; ask your health care provider or pharmacist if you have questions.  What should I tell my health care provider before I take this medicine?  They need to know if you have any of these conditions:  -blood disorders  -hearing problems  -kidney disease  -recent or ongoing radiation therapy  -an unusual or allergic reaction to carboplatin, cisplatin, other chemotherapy, other medicines, foods, dyes, or preservatives  -pregnant or trying to get pregnant  -breast-feeding  How should I use this medicine?  This drug is usually given as an infusion into a vein. It is administered in a hospital or clinic by a specially trained health care professional.  Talk to your pediatrician regarding the use of this medicine in children. Special care may be needed.  Overdosage: If you think you have taken too much of this medicine contact a poison control center or emergency room at once.  NOTE: This medicine is only for you. Do not share this medicine with others.  What if I miss a dose?  It is important not to miss a dose. Call your doctor or health care professional if you are unable to keep an appointment.  What may interact with this medicine?  -medicines for seizures  -medicines to increase blood counts like filgrastim, pegfilgrastim, sargramostim  -some antibiotics like amikacin, gentamicin, neomycin, streptomycin, tobramycin  -vaccines  Talk to your doctor or health care professional before taking any of these medicines:  -acetaminophen  -aspirin  -ibuprofen  -ketoprofen  -naproxen  This list may not describe all possible interactions. Give your health care provider a list of all the medicines, herbs, non-prescription drugs, or dietary  supplements you use. Also tell them if you smoke, drink alcohol, or use illegal drugs. Some items may interact with your medicine.  What should I watch for while using this medicine?  Your condition will be monitored carefully while you are receiving this medicine. You will need important blood work done while you are taking this medicine.  This drug may make you feel generally unwell. This is not uncommon, as chemotherapy can affect healthy cells as well as cancer cells. Report any side effects. Continue your course of treatment even though you feel ill unless your doctor tells you to stop.  In some cases, you may be given additional medicines to help with side effects. Follow all directions for their use.  Call your doctor or health care professional for advice if you get a fever, chills or sore throat, or other symptoms of a cold or flu. Do not treat yourself. This drug decreases your body's ability to fight infections. Try to avoid being around people who are sick.  This medicine may increase your risk to bruise or bleed. Call your doctor or health care professional if you notice any unusual bleeding.  Be careful brushing and flossing your teeth or using a toothpick because you may get an infection or bleed more easily. If you have any dental work done, tell your dentist you are receiving this medicine.  Avoid taking products that contain aspirin, acetaminophen, ibuprofen, naproxen, or ketoprofen unless instructed by your doctor. These medicines may hide a fever.  Do not become pregnant while taking this medicine.   Women should inform their doctor if they wish to become pregnant or think they might be pregnant. There is a potential for serious side effects to an unborn child. Talk to your health care professional or pharmacist for more information. Do not breast-feed an infant while taking this medicine.  What side effects may I notice from receiving this medicine?  Side effects that you should report to your  doctor or health care professional as soon as possible:  -allergic reactions like skin rash, itching or hives, swelling of the face, lips, or tongue  -signs of infection - fever or chills, cough, sore throat, pain or difficulty passing urine  -signs of decreased platelets or bleeding - bruising, pinpoint red spots on the skin, black, tarry stools, nosebleeds  -signs of decreased red blood cells - unusually weak or tired, fainting spells, lightheadedness  -breathing problems  -changes in hearing  -changes in vision  -chest pain  -high blood pressure  -low blood counts - This drug may decrease the number of white blood cells, red blood cells and platelets. You may be at increased risk for infections and bleeding.  -nausea and vomiting  -pain, swelling, redness or irritation at the injection site  -pain, tingling, numbness in the hands or feet  -problems with balance, talking, walking  -trouble passing urine or change in the amount of urine  Side effects that usually do not require medical attention (report to your doctor or health care professional if they continue or are bothersome):  -hair loss  -loss of appetite  -metallic taste in the mouth or changes in taste  This list may not describe all possible side effects. Call your doctor for medical advice about side effects. You may report side effects to FDA at 1-800-FDA-1088.  Where should I keep my medicine?  This drug is given in a hospital or clinic and will not be stored at home.  NOTE: This sheet is a summary. It may not cover all possible information. If you have questions about this medicine, talk to your doctor, pharmacist, or health care provider.      2016, [employer]vier/[employer]. (2007-06-12 14:38:05)

## 2016-01-06 NOTE — Progress Notes (Signed)
Patient here for follow up newly diagnosed with bladder cancer per family.

## 2016-01-06 NOTE — Consult Note (Signed)
NEW PATIENT EVALUATION  Name: Nathaniel Armstrong  MRN: [id number]  Date:   01/06/2016     DOB: 07-25-1932   This 82 y.o. 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  REFERRING PHYSICIAN: [facility] [place]:  Chief Complaint  Patient presents with  . Bladder Cancer    Initial Consult    DIAGNOSIS: The encounter diagnosis was Malignant neoplasm of lateral wall of urinary bladder ([place]).   PREVIOUS INVESTIGATIONS:  CT scans reviewed Pathology reports reviewed Clinical notes reviewed  HPI: Patient is a 82 year old male history of pulmonary nodules and rising PSA who presented with painless gross hematuria. He was found on cystoscopy to have a nodular tumor arising from the right lateral bladder wall. This was demonstrated on CT scan. He underwent on 12/21/2015 a TURBT. Tumor was low-grade muscle and adjacent tumor with invasion of the lamina and muscularis propria. Mitomycin-C was instilled after resection. Patient has been seen by medical oncology and is now referred to radiation oncology for opinion. He is doing fairly well at this time still states she's having some urgency and frequency of urination no hematuria.  PLANNED TREATMENT REGIMEN: Possible radiation therapy  PAST MEDICAL HISTORY:  has a past medical history of Arthritis; Bladder cancer ([place]); COPD (chronic obstructive pulmonary disease) ([place]); Enlarged prostate; Gout; Hypertension; Peripheral vascular disease ([place]); and Shortness of breath dyspnea.    PAST SURGICAL HISTORY:  Past Surgical History:  Procedure Laterality Date  . CATARACT EXTRACTION W/ INTRAOCULAR LENS  IMPLANT, BILATERAL Bilateral   . HAND SURGERY  1978  . TRANSURETHRAL RESECTION OF BLADDER TUMOR WITH MITOMYCIN-C N/A 12/21/2015   Procedure: TRANSURETHRAL RESECTION OF BLADDER TUMOR WITH MITOMYCIN-C;  Surgeon: Hollice Espy, MD;   Location: [facility] ORS;  Service: Urology;  Laterality: N/A;    FAMILY HISTORY: family history includes Hypertension in his father, mother, and sister; Stroke in his sister.  SOCIAL HISTORY:  reports that he has been smoking Cigarettes.  He has a 36.00 pack-year smoking history. He has never used smokeless tobacco. He reports that he does not drink alcohol or use drugs.  ALLERGIES: Penicillins  MEDICATIONS:  Current Outpatient Prescriptions  Medication Sig Dispense Refill  . albuterol (PROVENTIL HFA;VENTOLIN HFA) 108 (90 Base) MCG/ACT inhaler Inhale 2 puffs into the lungs every 6 (six) hours as needed for wheezing or shortness of breath.    . allopurinol (ZYLOPRIM) 100 MG tablet Take 100 mg by mouth 2 (two) times daily.    Marland Kitchen amLODipine (NORVASC) 10 MG tablet Take 10 mg by mouth daily.     . beclomethasone (QVAR) 40 MCG/ACT inhaler Inhale 1 puff into the lungs 2 (two) times daily.    . Cholecalciferol (VITAMIN D3) 1000 UNITS CAPS Take 1 capsule by mouth daily.     . colchicine 0.6 MG tablet Take 0.6 mg by mouth daily.    Marland Kitchen docusate sodium (COLACE) 100 MG capsule Take 1 capsule (100 mg total) by mouth 2 (two) times daily. (Patient not taking: Reported on 12/29/2015) 60 capsule 0  . finasteride (PROSCAR) 5 MG tablet Take 1 tablet (5 mg total) by mouth daily. 90 tablet 3  . HYDROcodone-acetaminophen (NORCO/VICODIN) 5-325 MG tablet Take 1-2 tablets by mouth every 6 (six) hours as needed for moderate pain. (Patient not taking: Reported on 12/29/2015) 10 tablet 0  . losartan (COZAAR) 50 MG tablet Take 50 mg by mouth daily.  No current facility-administered medications for this encounter.     ECOG PERFORMANCE STATUS:  0 - Asymptomatic  REVIEW OF SYSTEMS: Patient is a poor historian does have increased lower urinary tract symptoms otherwise Patient denies any weight loss, fatigue, weakness, fever, chills or night sweats. Patient denies any loss of vision, blurred vision. Patient denies any  ringing  of the ears or hearing loss. No irregular heartbeat. Patient denies heart murmur or history of fainting. Patient denies any chest pain or pain radiating to her upper extremities. Patient denies any shortness of breath, difficulty breathing at night, cough or hemoptysis. Patient denies any swelling in the lower legs. Patient denies any nausea vomiting, vomiting of blood, or coffee ground material in the vomitus. Patient denies any stomach pain. Patient states has had normal bowel movements no significant constipation or diarrhea. Patient denies any dysuria, hematuria or significant nocturia. Patient denies any problems walking, swelling in the joints or loss of balance. Patient denies any skin changes, loss of hair or loss of weight. Patient denies any excessive worrying or anxiety or significant depression. Patient denies any problems with insomnia. Patient denies excessive thirst, polyuria, polydipsia. Patient denies any swollen glands, patient denies easy bruising or easy bleeding. Patient denies any recent infections, allergies or URI. Patient "s visual fields have not changed significantly in recent time.    PHYSICAL EXAM: BP (!) 151/62   Pulse 74   Temp 98 F (36.7 C)   Wt 130 lb 1.1 oz (59 kg)   BMI 20.37 kg/m  Elderly male in NAD. No suprapubic pain or tenderness is noted. Well-developed well-nourished patient in NAD. HEENT reveals PERLA, EOMI, discs not visualized.  Oral cavity is clear. No oral mucosal lesions are identified. Neck is clear without evidence of cervical or supraclavicular adenopathy. Lungs are clear to A&P. Cardiac examination is essentially unremarkable with regular rate and rhythm without murmur rub or thrill. Abdomen is benign with no organomegaly or masses noted. Motor sensory and DTR levels are equal and symmetric in the upper and lower extremities. Cranial nerves II through XII are grossly intact. Proprioception is intact. No peripheral adenopathy or edema is  identified. No motor or sensory levels are noted. Crude visual fields are within normal range.  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 Would take those areas to 4500 cGy and boost the bladder another 2000 cGy. Risks and benefits of treatment including increasing lower urinary tract symptoms possible diarrhea, fatigue skin reaction alteration of blood counts all were discussed in detail with the patient. He is accompanied take by his multiple daughters. Patient is reluctant to undergo any chemotherapy or radiation therapy so I've given him a week to decide and have personally set up and ordered CT simulation at that time.  I would like to take this opportunity to thank you for allowing me to participate in the care of your patient.Armstead Peaks., MD

## 2016-01-09 ENCOUNTER — Encounter: Payer: Self-pay | Admitting: Hematology and Oncology

## 2016-01-11 NOTE — Patient Instructions (Signed)
Carboplatin injection  What is this medicine?  CARBOPLATIN (KAR boe pla tin) is a chemotherapy drug. It targets fast dividing cells, like cancer cells, and causes these cells to die. This medicine is used to treat ovarian cancer and many other cancers.  This medicine may be used for other purposes; ask your health care provider or pharmacist if you have questions.  What should I tell my health care provider before I take this medicine?  They need to know if you have any of these conditions:  -blood disorders  -hearing problems  -kidney disease  -recent or ongoing radiation therapy  -an unusual or allergic reaction to carboplatin, cisplatin, other chemotherapy, other medicines, foods, dyes, or preservatives  -pregnant or trying to get pregnant  -breast-feeding  How should I use this medicine?  This drug is usually given as an infusion into a vein. It is administered in a hospital or clinic by a specially trained health care professional.  Talk to your pediatrician regarding the use of this medicine in children. Special care may be needed.  Overdosage: If you think you have taken too much of this medicine contact a poison control center or emergency room at once.  NOTE: This medicine is only for you. Do not share this medicine with others.  What if I miss a dose?  It is important not to miss a dose. Call your doctor or health care professional if you are unable to keep an appointment.  What may interact with this medicine?  -medicines for seizures  -medicines to increase blood counts like filgrastim, pegfilgrastim, sargramostim  -some antibiotics like amikacin, gentamicin, neomycin, streptomycin, tobramycin  -vaccines  Talk to your doctor or health care professional before taking any of these medicines:  -acetaminophen  -aspirin  -ibuprofen  -ketoprofen  -naproxen  This list may not describe all possible interactions. Give your health care provider a list of all the medicines, herbs, non-prescription drugs, or dietary  supplements you use. Also tell them if you smoke, drink alcohol, or use illegal drugs. Some items may interact with your medicine.  What should I watch for while using this medicine?  Your condition will be monitored carefully while you are receiving this medicine. You will need important blood work done while you are taking this medicine.  This drug may make you feel generally unwell. This is not uncommon, as chemotherapy can affect healthy cells as well as cancer cells. Report any side effects. Continue your course of treatment even though you feel ill unless your doctor tells you to stop.  In some cases, you may be given additional medicines to help with side effects. Follow all directions for their use.  Call your doctor or health care professional for advice if you get a fever, chills or sore throat, or other symptoms of a cold or flu. Do not treat yourself. This drug decreases your body's ability to fight infections. Try to avoid being around people who are sick.  This medicine may increase your risk to bruise or bleed. Call your doctor or health care professional if you notice any unusual bleeding.  Be careful brushing and flossing your teeth or using a toothpick because you may get an infection or bleed more easily. If you have any dental work done, tell your dentist you are receiving this medicine.  Avoid taking products that contain aspirin, acetaminophen, ibuprofen, naproxen, or ketoprofen unless instructed by your doctor. These medicines may hide a fever.  Do not become pregnant while taking this medicine.   Women should inform their doctor if they wish to become pregnant or think they might be pregnant. There is a potential for serious side effects to an unborn child. Talk to your health care professional or pharmacist for more information. Do not breast-feed an infant while taking this medicine.  What side effects may I notice from receiving this medicine?  Side effects that you should report to your  doctor or health care professional as soon as possible:  -allergic reactions like skin rash, itching or hives, swelling of the face, lips, or tongue  -signs of infection - fever or chills, cough, sore throat, pain or difficulty passing urine  -signs of decreased platelets or bleeding - bruising, pinpoint red spots on the skin, black, tarry stools, nosebleeds  -signs of decreased red blood cells - unusually weak or tired, fainting spells, lightheadedness  -breathing problems  -changes in hearing  -changes in vision  -chest pain  -high blood pressure  -low blood counts - This drug may decrease the number of white blood cells, red blood cells and platelets. You may be at increased risk for infections and bleeding.  -nausea and vomiting  -pain, swelling, redness or irritation at the injection site  -pain, tingling, numbness in the hands or feet  -problems with balance, talking, walking  -trouble passing urine or change in the amount of urine  Side effects that usually do not require medical attention (report to your doctor or health care professional if they continue or are bothersome):  -hair loss  -loss of appetite  -metallic taste in the mouth or changes in taste  This list may not describe all possible side effects. Call your doctor for medical advice about side effects. You may report side effects to FDA at 1-800-FDA-1088.  Where should I keep my medicine?  This drug is given in a hospital or clinic and will not be stored at home.  NOTE: This sheet is a summary. It may not cover all possible information. If you have questions about this medicine, talk to your doctor, pharmacist, or health care provider.      2016, [employer]vier/[employer]. (2007-06-12 14:38:05)

## 2016-01-12 ENCOUNTER — Inpatient Hospital Stay: Payer: Medicare Other

## 2016-01-12 ENCOUNTER — Encounter: Payer: Self-pay | Admitting: *Deleted

## 2016-01-13 ENCOUNTER — Ambulatory Visit
Admission: RE | Admit: 2016-01-13 | Discharge: 2016-01-13 | Disposition: A | Discharge status: Home / Self Care | Payer: Medicare Other | Source: Ambulatory Visit | Attending: Radiation Oncology | Admitting: Radiation Oncology

## 2016-01-13 ENCOUNTER — Encounter: Payer: Self-pay | Admitting: *Deleted

## 2016-01-13 DIAGNOSIS — Z510 Encounter for antineoplastic radiation therapy: Secondary | ICD-10-CM | POA: Diagnosis not present

## 2016-01-14 ENCOUNTER — Other Ambulatory Visit: Payer: Self-pay | Admitting: *Deleted

## 2016-01-14 DIAGNOSIS — C672 Malignant neoplasm of lateral wall of bladder: Secondary | ICD-10-CM

## 2016-01-15 ENCOUNTER — Telehealth: Payer: Self-pay | Admitting: *Deleted

## 2016-01-15 NOTE — Telephone Encounter (Signed)
I called and spoke to wife of pt and asked about if they had decided about taking chemotherapy along with radiation.  I had got a call from ana from radiation that he will start next week on Thursday and we did see where they went to chemo class..  She states they have not decided yet but will let us know.  I asked if she would like to call me or if If they would like me to call next week maybe in tues. She states that they will call me. They may wait until they come in Thursday and make the decision. I asked that they either come see me when they come next week of tell the radiation staff to call me. She will let us know through radiation staff

## 2016-01-18 DIAGNOSIS — Z510 Encounter for antineoplastic radiation therapy: Secondary | ICD-10-CM | POA: Diagnosis not present

## 2016-01-20 ENCOUNTER — Encounter: Payer: Self-pay | Admitting: *Deleted

## 2016-01-20 ENCOUNTER — Ambulatory Visit
Admission: RE | Admit: 2016-01-20 | Discharge: 2016-01-20 | Disposition: A | Discharge status: Home / Self Care | Payer: Medicare Other | Source: Ambulatory Visit | Attending: Radiation Oncology | Admitting: Radiation Oncology

## 2016-01-20 DIAGNOSIS — Z510 Encounter for antineoplastic radiation therapy: Secondary | ICD-10-CM | POA: Diagnosis not present

## 2016-01-21 ENCOUNTER — Telehealth: Payer: Self-pay | Admitting: *Deleted

## 2016-01-21 ENCOUNTER — Encounter: Payer: Self-pay | Admitting: *Deleted

## 2016-01-21 ENCOUNTER — Ambulatory Visit
Admission: RE | Admit: 2016-01-21 | Discharge: 2016-01-21 | Disposition: A | Discharge status: Home / Self Care | Payer: Medicare Other | Source: Ambulatory Visit | Attending: Radiation Oncology | Admitting: Radiation Oncology

## 2016-01-21 DIAGNOSIS — Z510 Encounter for antineoplastic radiation therapy: Secondary | ICD-10-CM | POA: Diagnosis not present

## 2016-01-21 NOTE — Telephone Encounter (Signed)
Called pt's house and no answer, then tried wife number and it is not accepting phone calls, then tried daughter and she answered and I asked them if pt has made a decision for whether to take chemo with his radiation or not. She asked him and he said to her that he only wants radiation and I heard him say this. I told them that I will speak to [facility] and we will contact them about next appt since choice made.

## 2016-01-22 ENCOUNTER — Ambulatory Visit
Admission: RE | Admit: 2016-01-22 | Discharge: 2016-01-22 | Disposition: A | Discharge status: Home / Self Care | Payer: Medicare Other | Source: Ambulatory Visit | Attending: Radiation Oncology | Admitting: Radiation Oncology

## 2016-01-22 DIAGNOSIS — Z510 Encounter for antineoplastic radiation therapy: Secondary | ICD-10-CM | POA: Diagnosis not present

## 2016-01-25 ENCOUNTER — Ambulatory Visit
Admission: RE | Admit: 2016-01-25 | Discharge: 2016-01-25 | Disposition: A | Discharge status: Home / Self Care | Payer: Medicare Other | Source: Ambulatory Visit | Attending: Radiation Oncology | Admitting: Radiation Oncology

## 2016-01-25 DIAGNOSIS — Z510 Encounter for antineoplastic radiation therapy: Secondary | ICD-10-CM | POA: Diagnosis not present

## 2016-01-26 ENCOUNTER — Ambulatory Visit
Admission: RE | Admit: 2016-01-26 | Discharge: 2016-01-26 | Disposition: A | Discharge status: Home / Self Care | Payer: Medicare Other | Source: Ambulatory Visit | Attending: Radiation Oncology | Admitting: Radiation Oncology

## 2016-01-26 DIAGNOSIS — Z510 Encounter for antineoplastic radiation therapy: Secondary | ICD-10-CM | POA: Diagnosis not present

## 2016-01-27 ENCOUNTER — Ambulatory Visit
Admission: RE | Admit: 2016-01-27 | Discharge: 2016-01-27 | Disposition: A | Discharge status: Home / Self Care | Payer: Medicare Other | Source: Ambulatory Visit | Attending: Radiation Oncology | Admitting: Radiation Oncology

## 2016-01-27 DIAGNOSIS — Z510 Encounter for antineoplastic radiation therapy: Secondary | ICD-10-CM | POA: Diagnosis not present

## 2016-01-28 ENCOUNTER — Other Ambulatory Visit: Payer: Medicare Other

## 2016-01-28 ENCOUNTER — Ambulatory Visit
Admission: RE | Admit: 2016-01-28 | Discharge: 2016-01-28 | Disposition: A | Discharge status: Home / Self Care | Payer: Medicare Other | Source: Ambulatory Visit | Attending: Radiation Oncology | Admitting: Radiation Oncology

## 2016-01-28 DIAGNOSIS — Z510 Encounter for antineoplastic radiation therapy: Secondary | ICD-10-CM | POA: Diagnosis not present

## 2016-01-29 ENCOUNTER — Ambulatory Visit: Payer: Medicare Other

## 2016-01-29 ENCOUNTER — Ambulatory Visit: Payer: Medicare Other | Admitting: Urology

## 2016-02-01 ENCOUNTER — Ambulatory Visit
Admission: RE | Admit: 2016-02-01 | Discharge: 2016-02-01 | Disposition: A | Discharge status: Home / Self Care | Payer: Medicare Other | Source: Ambulatory Visit | Attending: Radiation Oncology | Admitting: Radiation Oncology

## 2016-02-01 ENCOUNTER — Other Ambulatory Visit: Payer: Medicare Other

## 2016-02-01 DIAGNOSIS — Z510 Encounter for antineoplastic radiation therapy: Secondary | ICD-10-CM | POA: Diagnosis not present

## 2016-02-02 ENCOUNTER — Ambulatory Visit
Admission: RE | Admit: 2016-02-02 | Discharge: 2016-02-02 | Disposition: A | Discharge status: Home / Self Care | Payer: Medicare Other | Source: Ambulatory Visit | Attending: Radiation Oncology | Admitting: Radiation Oncology

## 2016-02-02 DIAGNOSIS — Z510 Encounter for antineoplastic radiation therapy: Secondary | ICD-10-CM | POA: Diagnosis not present

## 2016-02-03 ENCOUNTER — Ambulatory Visit
Admission: RE | Admit: 2016-02-03 | Discharge: 2016-02-03 | Disposition: A | Discharge status: Home / Self Care | Payer: Medicare Other | Source: Ambulatory Visit | Attending: Radiation Oncology | Admitting: Radiation Oncology

## 2016-02-03 DIAGNOSIS — Z510 Encounter for antineoplastic radiation therapy: Secondary | ICD-10-CM | POA: Diagnosis not present

## 2016-02-04 ENCOUNTER — Ambulatory Visit
Admission: RE | Admit: 2016-02-04 | Discharge: 2016-02-04 | Disposition: A | Discharge status: Home / Self Care | Payer: Medicare Other | Source: Ambulatory Visit | Attending: Radiation Oncology | Admitting: Radiation Oncology

## 2016-02-04 DIAGNOSIS — Z510 Encounter for antineoplastic radiation therapy: Secondary | ICD-10-CM | POA: Diagnosis not present

## 2016-02-05 ENCOUNTER — Ambulatory Visit
Admission: RE | Admit: 2016-02-05 | Discharge: 2016-02-05 | Disposition: A | Discharge status: Home / Self Care | Payer: Medicare Other | Source: Ambulatory Visit | Attending: Radiation Oncology | Admitting: Radiation Oncology

## 2016-02-05 DIAGNOSIS — Z510 Encounter for antineoplastic radiation therapy: Secondary | ICD-10-CM | POA: Diagnosis not present

## 2016-02-08 ENCOUNTER — Other Ambulatory Visit: Payer: Medicare Other

## 2016-02-08 ENCOUNTER — Ambulatory Visit
Admission: RE | Admit: 2016-02-08 | Discharge: 2016-02-08 | Disposition: A | Discharge status: Home / Self Care | Payer: Medicare Other | Source: Ambulatory Visit | Attending: Radiation Oncology | Admitting: Radiation Oncology

## 2016-02-08 DIAGNOSIS — Z510 Encounter for antineoplastic radiation therapy: Secondary | ICD-10-CM | POA: Diagnosis not present

## 2016-02-09 ENCOUNTER — Ambulatory Visit
Admission: RE | Admit: 2016-02-09 | Discharge: 2016-02-09 | Disposition: A | Discharge status: Home / Self Care | Payer: Medicare Other | Source: Ambulatory Visit | Attending: Radiation Oncology | Admitting: Radiation Oncology

## 2016-02-09 DIAGNOSIS — Z510 Encounter for antineoplastic radiation therapy: Secondary | ICD-10-CM | POA: Diagnosis not present

## 2016-02-10 ENCOUNTER — Ambulatory Visit
Admission: RE | Admit: 2016-02-10 | Discharge: 2016-02-10 | Disposition: A | Discharge status: Home / Self Care | Payer: Medicare Other | Source: Ambulatory Visit | Attending: Radiation Oncology | Admitting: Radiation Oncology

## 2016-02-10 DIAGNOSIS — Z510 Encounter for antineoplastic radiation therapy: Secondary | ICD-10-CM | POA: Diagnosis not present

## 2016-02-15 ENCOUNTER — Other Ambulatory Visit: Payer: Medicare Other

## 2016-02-15 ENCOUNTER — Ambulatory Visit
Admission: RE | Admit: 2016-02-15 | Discharge: 2016-02-15 | Disposition: A | Discharge status: Home / Self Care | Payer: Medicare Other | Source: Ambulatory Visit | Attending: Radiation Oncology | Admitting: Radiation Oncology

## 2016-02-15 DIAGNOSIS — Z510 Encounter for antineoplastic radiation therapy: Secondary | ICD-10-CM | POA: Diagnosis not present

## 2016-02-16 ENCOUNTER — Ambulatory Visit
Admission: RE | Admit: 2016-02-16 | Discharge: 2016-02-16 | Disposition: A | Discharge status: Home / Self Care | Payer: Medicare Other | Source: Ambulatory Visit | Attending: Radiation Oncology | Admitting: Radiation Oncology

## 2016-02-16 DIAGNOSIS — Z510 Encounter for antineoplastic radiation therapy: Secondary | ICD-10-CM | POA: Diagnosis not present

## 2016-02-17 ENCOUNTER — Ambulatory Visit
Admission: RE | Admit: 2016-02-17 | Discharge: 2016-02-17 | Disposition: A | Discharge status: Home / Self Care | Payer: Medicare Other | Source: Ambulatory Visit | Attending: Radiation Oncology | Admitting: Radiation Oncology

## 2016-02-17 DIAGNOSIS — Z510 Encounter for antineoplastic radiation therapy: Secondary | ICD-10-CM | POA: Diagnosis not present

## 2016-02-18 ENCOUNTER — Ambulatory Visit
Admission: RE | Admit: 2016-02-18 | Discharge: 2016-02-18 | Disposition: A | Discharge status: Home / Self Care | Payer: Medicare Other | Source: Ambulatory Visit | Attending: Radiation Oncology | Admitting: Radiation Oncology

## 2016-02-18 DIAGNOSIS — Z510 Encounter for antineoplastic radiation therapy: Secondary | ICD-10-CM | POA: Diagnosis not present

## 2016-02-19 ENCOUNTER — Ambulatory Visit
Admission: RE | Admit: 2016-02-19 | Discharge: 2016-02-19 | Disposition: A | Discharge status: Home / Self Care | Payer: Medicare Other | Source: Ambulatory Visit | Attending: Radiation Oncology | Admitting: Radiation Oncology

## 2016-02-19 DIAGNOSIS — Z510 Encounter for antineoplastic radiation therapy: Secondary | ICD-10-CM | POA: Diagnosis not present

## 2016-02-22 ENCOUNTER — Ambulatory Visit
Admission: RE | Admit: 2016-02-22 | Discharge: 2016-02-22 | Disposition: A | Discharge status: Home / Self Care | Payer: Medicare Other | Source: Ambulatory Visit | Attending: Radiation Oncology | Admitting: Radiation Oncology

## 2016-02-22 ENCOUNTER — Other Ambulatory Visit: Payer: Medicare Other

## 2016-02-22 DIAGNOSIS — Z510 Encounter for antineoplastic radiation therapy: Secondary | ICD-10-CM | POA: Diagnosis not present

## 2016-02-23 ENCOUNTER — Ambulatory Visit
Admission: RE | Admit: 2016-02-23 | Discharge: 2016-02-23 | Disposition: A | Discharge status: Home / Self Care | Payer: Medicare Other | Source: Ambulatory Visit | Attending: Radiation Oncology | Admitting: Radiation Oncology

## 2016-02-23 DIAGNOSIS — Z510 Encounter for antineoplastic radiation therapy: Secondary | ICD-10-CM | POA: Diagnosis not present

## 2016-02-24 ENCOUNTER — Ambulatory Visit
Admission: RE | Admit: 2016-02-24 | Discharge: 2016-02-24 | Disposition: A | Discharge status: Home / Self Care | Payer: Medicare Other | Source: Ambulatory Visit | Attending: Radiation Oncology | Admitting: Radiation Oncology

## 2016-02-24 DIAGNOSIS — Z510 Encounter for antineoplastic radiation therapy: Secondary | ICD-10-CM | POA: Diagnosis not present

## 2016-02-25 ENCOUNTER — Ambulatory Visit
Admission: RE | Admit: 2016-02-25 | Discharge: 2016-02-25 | Disposition: A | Discharge status: Home / Self Care | Payer: Medicare Other | Source: Ambulatory Visit | Attending: Radiation Oncology | Admitting: Radiation Oncology

## 2016-02-25 DIAGNOSIS — Z510 Encounter for antineoplastic radiation therapy: Secondary | ICD-10-CM | POA: Diagnosis not present

## 2016-02-26 ENCOUNTER — Ambulatory Visit: Payer: Medicare Other

## 2016-02-26 DIAGNOSIS — Z510 Encounter for antineoplastic radiation therapy: Secondary | ICD-10-CM | POA: Diagnosis not present

## 2016-02-29 ENCOUNTER — Other Ambulatory Visit: Payer: Medicare Other

## 2016-02-29 ENCOUNTER — Ambulatory Visit: Payer: Medicare Other

## 2016-02-29 DIAGNOSIS — Z510 Encounter for antineoplastic radiation therapy: Secondary | ICD-10-CM | POA: Diagnosis not present

## 2016-03-01 ENCOUNTER — Ambulatory Visit: Payer: Medicare Other

## 2016-03-01 DIAGNOSIS — Z510 Encounter for antineoplastic radiation therapy: Secondary | ICD-10-CM | POA: Diagnosis not present

## 2016-03-02 ENCOUNTER — Ambulatory Visit
Admission: RE | Admit: 2016-03-02 | Discharge: 2016-03-02 | Disposition: A | Discharge status: Home / Self Care | Payer: Medicare Other | Source: Ambulatory Visit | Attending: Radiation Oncology | Admitting: Radiation Oncology

## 2016-03-02 DIAGNOSIS — Z510 Encounter for antineoplastic radiation therapy: Secondary | ICD-10-CM | POA: Diagnosis not present

## 2016-03-03 ENCOUNTER — Ambulatory Visit
Admission: RE | Admit: 2016-03-03 | Discharge: 2016-03-03 | Disposition: A | Discharge status: Home / Self Care | Payer: Medicare Other | Source: Ambulatory Visit | Attending: Radiation Oncology | Admitting: Radiation Oncology

## 2016-03-03 ENCOUNTER — Encounter: Payer: Self-pay | Admitting: Emergency Medicine

## 2016-03-03 ENCOUNTER — Emergency Department
Admission: EM | Admit: 2016-03-03 | Discharge: 2016-03-03 | Disposition: A | Discharge status: Home / Self Care | Payer: Medicare Other | Attending: Emergency Medicine | Admitting: Emergency Medicine

## 2016-03-03 ENCOUNTER — Emergency Department: Payer: Medicare Other

## 2016-03-03 DIAGNOSIS — I10 Essential (primary) hypertension: Secondary | ICD-10-CM | POA: Insufficient documentation

## 2016-03-03 DIAGNOSIS — M25551 Pain in right hip: Secondary | ICD-10-CM | POA: Diagnosis present

## 2016-03-03 DIAGNOSIS — Z8551 Personal history of malignant neoplasm of bladder: Secondary | ICD-10-CM | POA: Diagnosis not present

## 2016-03-03 DIAGNOSIS — M5431 Sciatica, right side: Secondary | ICD-10-CM | POA: Insufficient documentation

## 2016-03-03 DIAGNOSIS — R8299 Other abnormal findings in urine: Secondary | ICD-10-CM | POA: Insufficient documentation

## 2016-03-03 DIAGNOSIS — F17210 Nicotine dependence, cigarettes, uncomplicated: Secondary | ICD-10-CM | POA: Insufficient documentation

## 2016-03-03 DIAGNOSIS — J449 Chronic obstructive pulmonary disease, unspecified: Secondary | ICD-10-CM | POA: Insufficient documentation

## 2016-03-03 DIAGNOSIS — Z79899 Other long term (current) drug therapy: Secondary | ICD-10-CM | POA: Insufficient documentation

## 2016-03-03 DIAGNOSIS — Z510 Encounter for antineoplastic radiation therapy: Secondary | ICD-10-CM | POA: Diagnosis not present

## 2016-03-03 LAB — URINALYSIS, COMPLETE (UACMP) WITH MICROSCOPIC
BACTERIA UA: NONE SEEN
Bilirubin Urine: NEGATIVE
Glucose, UA: NEGATIVE mg/dL
Ketones, ur: NEGATIVE mg/dL
NITRITE: NEGATIVE
PH: 5 (ref 5.0–8.0)
Protein, ur: NEGATIVE mg/dL
SPECIFIC GRAVITY, URINE: 1.014 (ref 1.005–1.030)

## 2016-03-03 LAB — CBC WITH DIFFERENTIAL/PLATELET
BASOS ABS: 0 10*3/uL (ref 0–0.1)
BASOS PCT: 0 %
EOS ABS: 0 10*3/uL (ref 0–0.7)
EOS PCT: 0 %
HEMATOCRIT: 28.7 % — AB (ref 40.0–52.0)
Hemoglobin: 9.6 g/dL — ABNORMAL LOW (ref 13.0–18.0)
Lymphocytes Relative: 3 %
Lymphs Abs: 0.3 10*3/uL — ABNORMAL LOW (ref 1.0–3.6)
MCH: 31.3 pg (ref 26.0–34.0)
MCHC: 33.5 g/dL (ref 32.0–36.0)
MCV: 93.5 fL (ref 80.0–100.0)
MONO ABS: 0.9 10*3/uL (ref 0.2–1.0)
MONOS PCT: 8 %
Neutro Abs: 9.2 10*3/uL — ABNORMAL HIGH (ref 1.4–6.5)
Neutrophils Relative %: 89 %
PLATELETS: 358 10*3/uL (ref 150–440)
RBC: 3.07 MIL/uL — ABNORMAL LOW (ref 4.40–5.90)
RDW: 18.8 % — AB (ref 11.5–14.5)
WBC: 10.4 10*3/uL (ref 3.8–10.6)

## 2016-03-03 LAB — BASIC METABOLIC PANEL
Anion gap: 10 (ref 5–15)
BUN: 33 mg/dL — AB (ref 6–20)
CALCIUM: 9.1 mg/dL (ref 8.9–10.3)
CO2: 25 mmol/L (ref 22–32)
CREATININE: 1.13 mg/dL (ref 0.61–1.24)
Chloride: 99 mmol/L — ABNORMAL LOW (ref 101–111)
GFR calc Af Amer: >60 mL/min (ref >60)
GFR, EST NON AFRICAN AMERICAN: 59 mL/min — AB (ref >60)
GLUCOSE: 109 mg/dL — AB (ref 65–99)
Potassium: 3.5 mmol/L (ref 3.5–5.1)
Sodium: 134 mmol/L — ABNORMAL LOW (ref 135–145)

## 2016-03-03 MED ORDER — IBUPROFEN 600 MG PO TABS: 600.0000 mg | ORAL_TABLET | Freq: Three times a day (TID) | ORAL | 0 refills | Status: DC | PRN

## 2016-03-03 MED ORDER — IBUPROFEN 600 MG PO TABS
600.0000 mg | ORAL_TABLET | Freq: Once | ORAL | Status: AC
  Administered 2016-03-03: 600 mg via ORAL
  Filled 2016-03-03: qty 1

## 2016-03-03 NOTE — Discharge Instructions (Addendum)
You were evaluated for right hip pain which I suspect is coming from pinched nerve called sciatica.  Please take ibuprofen for pain and antiinflammatory affect.  Return to the ER for any worsening pain, numbness, weakness, urine or stool incontinence, abdominal pain, or any other symptoms concerning to you. As discussed, please follow up with your cancer doctor within 1 week.

## 2016-03-03 NOTE — ED Notes (Signed)
edp in to see pt at this time.  

## 2016-03-03 NOTE — ED Notes (Signed)
Pt up walking around in room with no acute distress noted.

## 2016-03-03 NOTE — ED Triage Notes (Signed)
C/O right hip pain.x 1 week.  Denies injury.  Patient currently receiving radiation treatments for bladder ca daily.

## 2016-03-03 NOTE — ED Notes (Signed)
edp in to re eval pt and give updates. Urine obtained and sent to lab.

## 2016-03-03 NOTE — ED Notes (Signed)
Patient transported to X-ray 

## 2016-03-03 NOTE — ED Notes (Signed)
Pt did not want vs checked at time of discharged. nad noted, pt a/ox3 and ambulatory around the room.

## 2016-03-03 NOTE — ED Provider Notes (Signed)
[facility] Emergency Department Provider Note ____________________________________________   I have reviewed the triage vital signs and the triage nursing note.  HISTORY  Chief Complaint Hip Pain   Historian Patient, with wife and daughter at the bedside  HPI Nathaniel Armstrong is a 82 y.o. male with a history of bladder cancer currently under radiation treatments, presents with what sounds like potentially several weeks of intermittent "right hip pain "that has become worse over the past several days. No reported traumatic injury or overuse injury. He points to the side of his right hip and says that he has moments where it's really sharp and goes down the side of the right leg. He states that he always has trouble urinating, and has the active bladder cancer followed by the cancer center. His abdominal pain. Denies flank or spine pain.  He's been trying Tylenol at home. He also occasionally complains of his right knee hurts, has a history of gout, but no new swelling or redness. He also carries a diagnosis of arthritis.    Past Medical History:  Diagnosis Date  . Arthritis   . Bladder cancer ([place])   . COPD (chronic obstructive pulmonary disease) ([place])   . Enlarged prostate   . Gout   . Hypertension   . Peripheral vascular disease ([place])   . Shortness of breath dyspnea     Patient Active Problem List   Diagnosis Date Noted  . Bladder cancer ([place]) 01/06/2016  . H/O cardiovascular disorder 04/09/2014  . CAFL (chronic airflow limitation) ([place]) 07/25/2013  . 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 BP (high blood pressure) 07/25/2013  . Lung mass 07/25/2013  . Chronic uveitis 07/25/2013  . Cigarette smoker 07/25/2013    Past Surgical History:  Procedure Laterality Date  . CATARACT EXTRACTION W/ INTRAOCULAR LENS  IMPLANT, BILATERAL Bilateral   . HAND SURGERY  1978  . TRANSURETHRAL RESECTION OF BLADDER TUMOR WITH MITOMYCIN-C N/A 12/21/2015   Procedure: TRANSURETHRAL  RESECTION OF BLADDER TUMOR WITH MITOMYCIN-C;  Surgeon: Hollice Espy, MD;  Location: [facility] ORS;  Service: Urology;  Laterality: N/A;    Prior to Admission medications   Medication Sig Start Date End Date Taking? Authorizing Provider  albuterol (PROVENTIL HFA;VENTOLIN HFA) 108 (90 Base) MCG/ACT inhaler Inhale 2 puffs into the lungs every 6 (six) hours as needed for wheezing or shortness of breath.    Historical Provider, MD  allopurinol (ZYLOPRIM) 100 MG tablet Take 100 mg by mouth 2 (two) times daily.    Historical Provider, MD  amLODipine (NORVASC) 10 MG tablet Take 10 mg by mouth daily.     Historical Provider, MD  Cholecalciferol (VITAMIN D3) 1000 UNITS CAPS Take 1 capsule by mouth daily.     Historical Provider, MD  colchicine 0.6 MG tablet Take 0.6 mg by mouth daily.    Historical Provider, MD  docusate sodium (COLACE) 100 MG capsule Take 1 capsule (100 mg total) by mouth 2 (two) times daily. 12/21/15   Hollice Espy, MD  finasteride (PROSCAR) 5 MG tablet Take 1 tablet (5 mg total) by mouth daily. 10/27/15   Nori Riis, PA-C  HYDROcodone-acetaminophen (NORCO/VICODIN) 5-325 MG tablet Take 1-2 tablets by mouth every 6 (six) hours as needed for moderate pain. Patient not taking: Reported on 01/06/2016 12/21/15   Hollice Espy, MD  losartan (COZAAR) 50 MG tablet Take 50 mg by mouth daily.  09/16/14   Historical Provider, MD    Allergies  Allergen Reactions  . Penicillins Rash and Other (See Comments)  Family History  Problem Relation Age of Onset  . Hypertension Mother   . Hypertension Father   . Stroke Sister   . Hypertension Sister   . Prostate cancer Neg Hx   . Kidney cancer Neg Hx     Social History Social History  Substance Use Topics  . Smoking status: Current Every Day Smoker    Packs/day: 0.50    Years: 72.00    Types: Cigarettes  . Smokeless tobacco: Never Used  . Alcohol use No    Review of Systems  Constitutional: Negative for fever. Eyes: Negative for  visual changes. ENT: Negative for sore throat. Cardiovascular: Negative for chest pain. Respiratory: Negative for shortness of breath. Gastrointestinal: Negative for abdominal pain, vomiting and diarrhea. Genitourinary: Negative for dysuria. Musculoskeletal: As per history of present illness, right hip pain and occasionally right knee pain. Skin: Negative for rash. Neurological: Negative for headache. 10 point Review of Systems otherwise negative ____________________________________________   PHYSICAL EXAM:  VITAL SIGNS: ED Triage Vitals  Enc Vitals Group     BP 03/03/16 1134 (!) 135/49     Pulse Rate 03/03/16 1134 85     Resp 03/03/16 1134 16     Temp 03/03/16 1134 97.8 F (36.6 C)     Temp Source 03/03/16 1134 Oral     SpO2 03/03/16 1134 100 %     Weight 03/03/16 1133 125 lb (56.7 kg)     Height 03/03/16 1133 5\' 7"  (1.702 m)     Head Circumference --      Peak Flow --      Pain Score 03/03/16 1133 10     Pain Loc --      Pain Edu? --      Excl. in [place]? --      Constitutional: Alert and oriented. Well appearing and in no distress.  Cachectic. HEENT   Head: Normocephalic and atraumatic.      Eyes: Conjunctivae are normal. PERRL. Normal extraocular movements.      Ears:         Nose: No congestion/rhinnorhea.   Mouth/Throat: Mucous membranes are moist.   Neck: No stridor. Cardiovascular/Chest: Normal rate, regular rhythm.  No murmurs, rubs, or gallops. Respiratory: Normal respiratory effort without tachypnea nor retractions. Breath sounds are clear and equal bilaterally. No wheezes/rales/rhonchi. Gastrointestinal: Soft. No distention, no guarding, no rebound. Nontender abdomen.  Genitourinary/rectal:Deferred Musculoskeletal: Pelvis stable, right hip joint does not appear to be tender to active or passive range of motion here on the bed, or weight loading from the foot. Normal appearance of the right knee without tenderness to range of motion, no joint swelling  or redness. No edema to bilateral lower extremities. Neurologic:  Normal speech and language. No gross or focal neurologic deficits are appreciated. Skin:  Skin is warm, dry and intact. No rash noted. Psychiatric: Mood and affect are normal. Speech and behavior are normal. Patient exhibits appropriate insight and judgment.   ____________________________________________  LABS (pertinent positives/negatives)  Labs Reviewed  BASIC METABOLIC PANEL - Abnormal; Notable for the following:       Result Value   Sodium 134 (*)    Chloride 99 (*)    Glucose, Bld 109 (*)    BUN 33 (*)    GFR calc non Af Amer 59 (*)    All other components within normal limits  CBC WITH DIFFERENTIAL/PLATELET - Abnormal; Notable for the following:    RBC 3.07 (*)    Hemoglobin 9.6 (*)  HCT 28.7 (*)    RDW 18.8 (*)    Neutro Abs 9.2 (*)    Lymphs Abs 0.3 (*)    All other components within normal limits  URINALYSIS, COMPLETE (UACMP) WITH MICROSCOPIC - Abnormal; Notable for the following:    Color, Urine YELLOW (*)    APPearance CLEAR (*)    Hgb urine dipstick MODERATE (*)    Leukocytes, UA SMALL (*)    Squamous Epithelial / LPF 0-5 (*)    All other components within normal limits    ____________________________________________    EKG I, Lisa Roca, MD, the attending physician have personally viewed and interpreted all ECGs.  None ____________________________________________  RADIOLOGY All Xrays were viewed by me. Imaging interpreted by Radiologist.  Right hip and pelvis x-ray:  IMPRESSION: 1. No acute fracture. 2. Diffuse osteopenia. 3. AVN of the left femoral head as noted previously.  X-ray Lumbar spine 2 view:  IMPRESSION: 1. Normal alignment with diffuse degenerative change as noted previously. No acute compression deformity. 2. Osteopenia. __________________________________________  PROCEDURES  Procedure(s) performed: None  Critical Care performed:  None  ____________________________________________   ED COURSE / ASSESSMENT AND PLAN  Pertinent labs & imaging results that were available during my care of the patient were reviewed by me and considered in my medical decision making (see chart for details).   Mr. Rensel is here for what sounds like several weeks of intermittent right hip pain. Symptoms seem potentially more likely to be due to sciatica rather than injury or arthritis to the right hip joint itself given that I'm really unable to bring on the pain on clinical exam today. We did discuss that with a active cancer diagnosis, bony metastasis would always be a packet of my consideration, but that x-rays may not fully evaluate this and if he continues to have pain, he needs to  discuss this with his cancer doctor.  I'm going to image the right hip and lumbar spine for any bony abnormalities. I did recommend laboratory studies including urinalysis and his basic blood work although I do not have a high suspicion of an intra-abdominal process giving him radiating right hip/leg pain.  Xrays negative for acute findings/source for pain.  Patient improved after ibuprofen.  We discussed antiinflammatory treatment for suspected sciatica.  Follow up with pcp and oncology.    CONSULTATIONS:  None   Patient / Family / Caregiver informed of clinical course, medical decision-making process, and agree with plan.   I discussed return precautions, follow-up instructions, and discharge instructions with patient and/or family.   ___________________________________________   FINAL CLINICAL IMPRESSION(S) / ED DIAGNOSES   Final diagnoses:  Sciatica of right side              Note: This dictation was prepared with Dragon dictation. Any transcriptional errors that result from this process are unintentional    Lisa Roca, MD 03/03/16 1610

## 2016-03-04 ENCOUNTER — Ambulatory Visit
Admission: RE | Admit: 2016-03-04 | Discharge: 2016-03-04 | Disposition: A | Discharge status: Home / Self Care | Payer: Medicare Other | Source: Ambulatory Visit | Attending: Radiation Oncology | Admitting: Radiation Oncology

## 2016-03-04 DIAGNOSIS — Z510 Encounter for antineoplastic radiation therapy: Secondary | ICD-10-CM | POA: Diagnosis not present

## 2016-03-05 LAB — URINE CULTURE

## 2016-03-07 ENCOUNTER — Other Ambulatory Visit: Payer: Medicare Other

## 2016-03-07 ENCOUNTER — Ambulatory Visit
Admission: RE | Admit: 2016-03-07 | Discharge: 2016-03-07 | Disposition: A | Discharge status: Home / Self Care | Payer: Medicare Other | Source: Ambulatory Visit | Attending: Radiation Oncology | Admitting: Radiation Oncology

## 2016-03-07 DIAGNOSIS — Z510 Encounter for antineoplastic radiation therapy: Secondary | ICD-10-CM | POA: Diagnosis not present

## 2016-03-08 ENCOUNTER — Ambulatory Visit
Admission: RE | Admit: 2016-03-08 | Discharge: 2016-03-08 | Disposition: A | Discharge status: Home / Self Care | Payer: Medicare Other | Source: Ambulatory Visit | Attending: Radiation Oncology | Admitting: Radiation Oncology

## 2016-03-08 DIAGNOSIS — Z510 Encounter for antineoplastic radiation therapy: Secondary | ICD-10-CM | POA: Diagnosis not present

## 2016-03-09 ENCOUNTER — Ambulatory Visit
Admission: RE | Admit: 2016-03-09 | Discharge: 2016-03-09 | Disposition: A | Discharge status: Home / Self Care | Payer: Medicare Other | Source: Ambulatory Visit | Attending: Radiation Oncology | Admitting: Radiation Oncology

## 2016-03-09 DIAGNOSIS — Z510 Encounter for antineoplastic radiation therapy: Secondary | ICD-10-CM | POA: Diagnosis not present

## 2016-03-10 ENCOUNTER — Ambulatory Visit
Admission: RE | Admit: 2016-03-10 | Discharge: 2016-03-10 | Disposition: A | Discharge status: Home / Self Care | Payer: Medicare Other | Source: Ambulatory Visit | Attending: Radiation Oncology | Admitting: Radiation Oncology

## 2016-03-10 DIAGNOSIS — Z510 Encounter for antineoplastic radiation therapy: Secondary | ICD-10-CM | POA: Diagnosis not present

## 2016-03-11 ENCOUNTER — Ambulatory Visit
Admission: RE | Admit: 2016-03-11 | Discharge: 2016-03-11 | Disposition: A | Discharge status: Home / Self Care | Payer: Medicare Other | Source: Ambulatory Visit | Attending: Radiation Oncology | Admitting: Radiation Oncology

## 2016-03-11 DIAGNOSIS — Z510 Encounter for antineoplastic radiation therapy: Secondary | ICD-10-CM | POA: Diagnosis not present

## 2016-03-15 ENCOUNTER — Ambulatory Visit: Payer: Medicare Other

## 2016-03-15 ENCOUNTER — Other Ambulatory Visit: Payer: Self-pay | Admitting: *Deleted

## 2016-03-15 DIAGNOSIS — Z510 Encounter for antineoplastic radiation therapy: Secondary | ICD-10-CM | POA: Diagnosis not present

## 2016-03-15 MED ORDER — IBUPROFEN 600 MG PO TABS: 600.0000 mg | ORAL_TABLET | Freq: Three times a day (TID) | ORAL | 1 refills | Status: AC | PRN

## 2016-03-16 ENCOUNTER — Ambulatory Visit: Payer: Medicare Other

## 2016-03-16 DIAGNOSIS — Z510 Encounter for antineoplastic radiation therapy: Secondary | ICD-10-CM | POA: Diagnosis not present

## 2016-03-26 ENCOUNTER — Encounter: Payer: Self-pay | Admitting: Emergency Medicine

## 2016-03-26 ENCOUNTER — Emergency Department: Payer: Medicare Other

## 2016-03-26 ENCOUNTER — Inpatient Hospital Stay
Admission: EM | Admit: 2016-03-26 | Discharge: 2016-03-29 | DRG: 640 | Disposition: A | Discharge status: Skilled Nursing Facility | Payer: Medicare Other | Attending: Internal Medicine | Admitting: Internal Medicine

## 2016-03-26 DIAGNOSIS — N3001 Acute cystitis with hematuria: Secondary | ICD-10-CM | POA: Diagnosis present

## 2016-03-26 DIAGNOSIS — J431 Panlobular emphysema: Secondary | ICD-10-CM

## 2016-03-26 DIAGNOSIS — I472 Ventricular tachycardia: Secondary | ICD-10-CM | POA: Diagnosis present

## 2016-03-26 DIAGNOSIS — C679 Malignant neoplasm of bladder, unspecified: Secondary | ICD-10-CM | POA: Diagnosis present

## 2016-03-26 DIAGNOSIS — Z9842 Cataract extraction status, left eye: Secondary | ICD-10-CM

## 2016-03-26 DIAGNOSIS — J410 Simple chronic bronchitis: Secondary | ICD-10-CM | POA: Diagnosis not present

## 2016-03-26 DIAGNOSIS — R918 Other nonspecific abnormal finding of lung field: Secondary | ICD-10-CM | POA: Diagnosis present

## 2016-03-26 DIAGNOSIS — F17210 Nicotine dependence, cigarettes, uncomplicated: Secondary | ICD-10-CM | POA: Diagnosis present

## 2016-03-26 DIAGNOSIS — Z681 Body mass index (BMI) 19 or less, adult: Secondary | ICD-10-CM | POA: Diagnosis not present

## 2016-03-26 DIAGNOSIS — J441 Chronic obstructive pulmonary disease with (acute) exacerbation: Secondary | ICD-10-CM | POA: Diagnosis not present

## 2016-03-26 DIAGNOSIS — J9601 Acute respiratory failure with hypoxia: Secondary | ICD-10-CM | POA: Diagnosis not present

## 2016-03-26 DIAGNOSIS — I10 Essential (primary) hypertension: Secondary | ICD-10-CM | POA: Diagnosis present

## 2016-03-26 DIAGNOSIS — R197 Diarrhea, unspecified: Secondary | ICD-10-CM

## 2016-03-26 DIAGNOSIS — I248 Other forms of acute ischemic heart disease: Secondary | ICD-10-CM | POA: Diagnosis present

## 2016-03-26 DIAGNOSIS — Z923 Personal history of irradiation: Secondary | ICD-10-CM | POA: Diagnosis not present

## 2016-03-26 DIAGNOSIS — N400 Enlarged prostate without lower urinary tract symptoms: Secondary | ICD-10-CM | POA: Diagnosis present

## 2016-03-26 DIAGNOSIS — E860 Dehydration: Secondary | ICD-10-CM | POA: Diagnosis present

## 2016-03-26 DIAGNOSIS — E876 Hypokalemia: Principal | ICD-10-CM | POA: Diagnosis present

## 2016-03-26 DIAGNOSIS — R531 Weakness: Secondary | ICD-10-CM

## 2016-03-26 DIAGNOSIS — E43 Unspecified severe protein-calorie malnutrition: Secondary | ICD-10-CM | POA: Diagnosis present

## 2016-03-26 DIAGNOSIS — I739 Peripheral vascular disease, unspecified: Secondary | ICD-10-CM | POA: Diagnosis present

## 2016-03-26 DIAGNOSIS — J449 Chronic obstructive pulmonary disease, unspecified: Secondary | ICD-10-CM | POA: Diagnosis present

## 2016-03-26 DIAGNOSIS — Z880 Allergy status to penicillin: Secondary | ICD-10-CM

## 2016-03-26 DIAGNOSIS — M109 Gout, unspecified: Secondary | ICD-10-CM | POA: Diagnosis present

## 2016-03-26 DIAGNOSIS — Z823 Family history of stroke: Secondary | ICD-10-CM

## 2016-03-26 DIAGNOSIS — Z9841 Cataract extraction status, right eye: Secondary | ICD-10-CM | POA: Diagnosis not present

## 2016-03-26 DIAGNOSIS — Z961 Presence of intraocular lens: Secondary | ICD-10-CM | POA: Diagnosis present

## 2016-03-26 DIAGNOSIS — I493 Ventricular premature depolarization: Secondary | ICD-10-CM | POA: Diagnosis present

## 2016-03-26 DIAGNOSIS — D638 Anemia in other chronic diseases classified elsewhere: Secondary | ICD-10-CM | POA: Diagnosis present

## 2016-03-26 DIAGNOSIS — Z79899 Other long term (current) drug therapy: Secondary | ICD-10-CM

## 2016-03-26 DIAGNOSIS — R627 Adult failure to thrive: Secondary | ICD-10-CM | POA: Diagnosis present

## 2016-03-26 DIAGNOSIS — L8990 Pressure ulcer of unspecified site, unspecified stage: Secondary | ICD-10-CM | POA: Insufficient documentation

## 2016-03-26 DIAGNOSIS — I959 Hypotension, unspecified: Secondary | ICD-10-CM | POA: Diagnosis not present

## 2016-03-26 DIAGNOSIS — Z8249 Family history of ischemic heart disease and other diseases of the circulatory system: Secondary | ICD-10-CM

## 2016-03-26 LAB — PHOSPHORUS: Phosphorus: 2.5 mg/dL (ref 2.5–4.6)

## 2016-03-26 LAB — BASIC METABOLIC PANEL
ANION GAP: 14 (ref 5–15)
BUN: 19 mg/dL (ref 6–20)
CALCIUM: 8.2 mg/dL — AB (ref 8.9–10.3)
CO2: 30 mmol/L (ref 22–32)
CREATININE: 0.96 mg/dL (ref 0.61–1.24)
Chloride: 95 mmol/L — ABNORMAL LOW (ref 101–111)
GLUCOSE: 137 mg/dL — AB (ref 65–99)
Potassium: 2.5 mmol/L — CL (ref 3.5–5.1)
Sodium: 139 mmol/L (ref 135–145)

## 2016-03-26 LAB — CBC
HCT: 30.1 % — ABNORMAL LOW (ref 40.0–52.0)
HEMOGLOBIN: 9.8 g/dL — AB (ref 13.0–18.0)
MCH: 30.1 pg (ref 26.0–34.0)
MCHC: 32.6 g/dL (ref 32.0–36.0)
MCV: 92.4 fL (ref 80.0–100.0)
PLATELETS: 395 10*3/uL (ref 150–440)
RBC: 3.26 MIL/uL — ABNORMAL LOW (ref 4.40–5.90)
RDW: 17.8 % — ABNORMAL HIGH (ref 11.5–14.5)
WBC: 14.2 10*3/uL — ABNORMAL HIGH (ref 3.8–10.6)

## 2016-03-26 LAB — TROPONIN I
TROPONIN I: 0.12 ng/mL — AB (ref <0.03)
Troponin I: 0.1 ng/mL (ref <0.03)
Troponin I: 0.1 ng/mL (ref <0.03)

## 2016-03-26 LAB — URINALYSIS, COMPLETE (UACMP) WITH MICROSCOPIC
Bilirubin Urine: NEGATIVE
GLUCOSE, UA: NEGATIVE mg/dL
Ketones, ur: NEGATIVE mg/dL
NITRITE: NEGATIVE
PH: 6 (ref 5.0–8.0)
Protein, ur: 100 mg/dL — AB
SPECIFIC GRAVITY, URINE: 1.013 (ref 1.005–1.030)

## 2016-03-26 LAB — TSH: TSH: 0.644 u[IU]/mL (ref 0.350–4.500)

## 2016-03-26 LAB — MAGNESIUM: MAGNESIUM: 1.1 mg/dL — AB (ref 1.7–2.4)

## 2016-03-26 MED ORDER — ALLOPURINOL 100 MG PO TABS
100.0000 mg | ORAL_TABLET | Freq: Two times a day (BID) | ORAL | Status: DC
  Administered 2016-03-26 – 2016-03-29 (×6): 100 mg via ORAL
  Filled 2016-03-26 (×6): qty 1

## 2016-03-26 MED ORDER — VITAMIN D 1000 UNITS PO TABS
1000.0000 [IU] | ORAL_TABLET | Freq: Every day | ORAL | Status: DC
  Administered 2016-03-27 – 2016-03-29 (×3): 1000 [IU] via ORAL
  Filled 2016-03-26 (×5): qty 1

## 2016-03-26 MED ORDER — MAGNESIUM SULFATE 4 GM/100ML IV SOLN
4.0000 g | INTRAVENOUS | Status: AC
  Administered 2016-03-26: 4 g via INTRAVENOUS
  Filled 2016-03-26: qty 100

## 2016-03-26 MED ORDER — ALBUTEROL SULFATE HFA 108 (90 BASE) MCG/ACT IN AERS: 2.0000 | INHALATION_SPRAY | Freq: Four times a day (QID) | RESPIRATORY_TRACT | Status: DC | PRN

## 2016-03-26 MED ORDER — POTASSIUM CHLORIDE CRYS ER 20 MEQ PO TBCR
40.0000 meq | EXTENDED_RELEASE_TABLET | Freq: Once | ORAL | Status: AC
  Administered 2016-03-26: 40 meq via ORAL

## 2016-03-26 MED ORDER — AMLODIPINE BESYLATE 10 MG PO TABS: 10.0000 mg | ORAL_TABLET | Freq: Every day | ORAL | Status: DC

## 2016-03-26 MED ORDER — ONDANSETRON HCL 4 MG PO TABS: 4.0000 mg | ORAL_TABLET | Freq: Four times a day (QID) | ORAL | Status: DC | PRN

## 2016-03-26 MED ORDER — ACETAMINOPHEN 325 MG PO TABS: 650.0000 mg | ORAL_TABLET | Freq: Four times a day (QID) | ORAL | Status: DC | PRN

## 2016-03-26 MED ORDER — OXYCODONE HCL 5 MG PO TABS
5.0000 mg | ORAL_TABLET | ORAL | Status: DC | PRN
  Administered 2016-03-27 – 2016-03-29 (×9): 5 mg via ORAL
  Filled 2016-03-26 (×9): qty 1

## 2016-03-26 MED ORDER — ACETAMINOPHEN 650 MG RE SUPP: 650.0000 mg | Freq: Four times a day (QID) | RECTAL | Status: DC | PRN

## 2016-03-26 MED ORDER — POTASSIUM CHLORIDE IN NACL 20-0.9 MEQ/L-% IV SOLN
INTRAVENOUS | Status: DC
  Administered 2016-03-27 (×2): via INTRAVENOUS
  Filled 2016-03-26 (×3): qty 1000

## 2016-03-26 MED ORDER — SODIUM CHLORIDE 0.9% FLUSH
3.0000 mL | Freq: Two times a day (BID) | INTRAVENOUS | Status: DC
  Administered 2016-03-26 – 2016-03-29 (×5): 3 mL via INTRAVENOUS

## 2016-03-26 MED ORDER — ONDANSETRON HCL 4 MG/2ML IJ SOLN: 4.0000 mg | Freq: Four times a day (QID) | INTRAMUSCULAR | Status: DC | PRN

## 2016-03-26 MED ORDER — ENOXAPARIN SODIUM 40 MG/0.4ML ~~LOC~~ SOLN
40.0000 mg | SUBCUTANEOUS | Status: DC
  Administered 2016-03-26 – 2016-03-28 (×3): 40 mg via SUBCUTANEOUS
  Filled 2016-03-26 (×3): qty 0.4

## 2016-03-26 MED ORDER — DOCUSATE SODIUM 100 MG PO CAPS
100.0000 mg | ORAL_CAPSULE | Freq: Two times a day (BID) | ORAL | Status: DC
  Administered 2016-03-26 – 2016-03-29 (×6): 100 mg via ORAL
  Filled 2016-03-26 (×6): qty 1

## 2016-03-26 MED ORDER — SENNOSIDES-DOCUSATE SODIUM 8.6-50 MG PO TABS: 1.0000 | ORAL_TABLET | Freq: Every evening | ORAL | Status: DC | PRN

## 2016-03-26 MED ORDER — LOSARTAN POTASSIUM 50 MG PO TABS
50.0000 mg | ORAL_TABLET | Freq: Every day | ORAL | Status: DC
  Administered 2016-03-27: 50 mg via ORAL
  Filled 2016-03-26: qty 1

## 2016-03-26 MED ORDER — SODIUM CHLORIDE 0.9 % IV SOLN
Freq: Once | INTRAVENOUS | Status: AC
  Administered 2016-03-26: 17:00:00 via INTRAVENOUS
  Filled 2016-03-26: qty 1000

## 2016-03-26 MED ORDER — SODIUM CHLORIDE 0.9 % IV SOLN
1000.0000 mL | Freq: Once | INTRAVENOUS | Status: AC
  Administered 2016-03-26: 1000 mL via INTRAVENOUS

## 2016-03-26 MED ORDER — FINASTERIDE 5 MG PO TABS
5.0000 mg | ORAL_TABLET | Freq: Every day | ORAL | Status: DC
  Administered 2016-03-27 – 2016-03-29 (×3): 5 mg via ORAL
  Filled 2016-03-26 (×3): qty 1

## 2016-03-26 MED ORDER — HYDRALAZINE HCL 20 MG/ML IJ SOLN: 10.0000 mg | Freq: Four times a day (QID) | INTRAMUSCULAR | Status: DC | PRN

## 2016-03-26 MED ORDER — INFLUENZA VAC SPLIT QUAD 0.5 ML IM SUSY
0.5000 mL | PREFILLED_SYRINGE | INTRAMUSCULAR | Status: DC
Start: 2016-03-27 — End: 2016-03-29

## 2016-03-26 MED ORDER — ALBUTEROL SULFATE (2.5 MG/3ML) 0.083% IN NEBU
2.5000 mg | INHALATION_SOLUTION | Freq: Four times a day (QID) | RESPIRATORY_TRACT | Status: DC | PRN
  Administered 2016-03-26 – 2016-03-27 (×2): 2.5 mg via RESPIRATORY_TRACT
  Filled 2016-03-26 (×2): qty 3

## 2016-03-26 MED ORDER — POTASSIUM CHLORIDE CRYS ER 20 MEQ PO TBCR
EXTENDED_RELEASE_TABLET | ORAL | Status: AC
  Administered 2016-03-26: 40 meq via ORAL
  Filled 2016-03-26: qty 2

## 2016-03-26 NOTE — ED Triage Notes (Addendum)
Pt is cancer pt with last RT tx dec 27th.  Has had increasing weakness over last week.  Pt has had cough.  Has had shortness of breath that is worse with exertion. Has had right hip pain.  Denies fevers. No respiratory distress noted in triage.

## 2016-03-26 NOTE — ED Notes (Signed)
Pt unable to urinate at this time; stated he would push the button if he feels he could urinate enough for specimen.

## 2016-03-26 NOTE — H&P (Addendum)
[place] at [place] NAME: Nathaniel Armstrong    MR#:  [id number]  DATE OF BIRTH:  14-Jan-1933  DATE OF ADMISSION:  03/26/2016  PRIMARY CARE PHYSICIAN: [place]   REQUESTING/REFERRING PHYSICIAN: Dr. Corky Downs  CHIEF COMPLAINT:   Weakness HISTORY OF PRESENT ILLNESS:  Nathaniel Armstrong  is a 83 y.o. male with a known history of Bladder cancer who completed radiation at the end of December who presents today with his family due to above complaint. Over the past week patient has had increasing generalized weakness. He denies any focal neurological deficits. He had diarrhea yesterday however this is resolved today. He denies abdominal pain, shortness breath, chest pain, nausea or vomiting.   In the emergency room potassium level was 2.5 and on telemetry he is having multiple PVCs PAST MEDICAL HISTORY:   Past Medical History:  Diagnosis Date  . Arthritis   . Bladder cancer ([place])   . COPD (chronic obstructive pulmonary disease) ([place])   . Enlarged prostate   . Gout   . Hypertension   . Peripheral vascular disease ([place])   . Shortness of breath dyspnea     PAST SURGICAL HISTORY:   Past Surgical History:  Procedure Laterality Date  . CATARACT EXTRACTION W/ INTRAOCULAR LENS  IMPLANT, BILATERAL Bilateral   . HAND SURGERY  1978  . TRANSURETHRAL RESECTION OF BLADDER TUMOR WITH MITOMYCIN-C N/A 12/21/2015   Procedure: TRANSURETHRAL RESECTION OF BLADDER TUMOR WITH MITOMYCIN-C;  Surgeon: Hollice Espy, MD;  Location: [facility] ORS;  Service: Urology;  Laterality: N/A;    SOCIAL HISTORY:   Social History  Substance Use Topics  . Smoking status: Current Every Day Smoker    Packs/day: 0.50    Years: 72.00    Types: Cigarettes  . Smokeless tobacco: Never Used  . Alcohol use No    FAMILY HISTORY:   Family History  Problem Relation Age of Onset  . Hypertension Mother   . Hypertension Father   . Stroke Sister   . Hypertension Sister   .  Prostate cancer Neg Hx   . Kidney cancer Neg Hx     DRUG ALLERGIES:   Allergies  Allergen Reactions  . Penicillins Rash and Other (See Comments)    REVIEW OF SYSTEMS:   Review of Systems  Constitutional: Positive for malaise/fatigue and weight loss. Negative for chills and fever.  HENT: Negative.  Negative for ear discharge, ear pain, hearing loss, nosebleeds and sore throat.   Eyes: Negative.  Negative for blurred vision and pain.  Respiratory: Negative.  Negative for cough, hemoptysis, shortness of breath and wheezing.   Cardiovascular: Negative.  Negative for chest pain, palpitations and leg swelling.  Gastrointestinal: Negative.  Negative for abdominal pain, blood in stool, diarrhea, nausea and vomiting.  Genitourinary: Negative.  Negative for dysuria.  Musculoskeletal: Negative.  Negative for back pain.  Skin: Negative.   Neurological: Positive for weakness. Negative for dizziness, tremors, speech change, focal weakness, seizures and headaches.  Endo/Heme/Allergies: Negative.  Does not bruise/bleed easily.  Psychiatric/Behavioral: Negative.  Negative for depression, hallucinations and suicidal ideas.    MEDICATIONS AT HOME:   Prior to Admission medications   Medication Sig Start Date End Date Taking? Authorizing Provider  albuterol (PROVENTIL HFA;VENTOLIN HFA) 108 (90 Base) MCG/ACT inhaler Inhale 2 puffs into the lungs every 6 (six) hours as needed for wheezing or shortness of breath.   Yes Historical Provider, MD  allopurinol (ZYLOPRIM) 100 MG tablet Take 100 mg by mouth  2 (two) times daily.   Yes Historical Provider, MD  amLODipine (NORVASC) 10 MG tablet Take 10 mg by mouth daily.    Yes Historical Provider, MD  Cholecalciferol (VITAMIN D3) 1000 UNITS CAPS Take 1 capsule by mouth daily.    Yes Historical Provider, MD  colchicine 0.6 MG tablet Take 0.6 mg by mouth daily as needed.    Yes Historical Provider, MD  docusate sodium (COLACE) 100 MG capsule Take 1 capsule (100 mg  total) by mouth 2 (two) times daily. 12/21/15  Yes Hollice Espy, MD  finasteride (PROSCAR) 5 MG tablet Take 1 tablet (5 mg total) by mouth daily. 10/27/15  Yes Shannon A McGowan, PA-C  ibuprofen (ADVIL,MOTRIN) 600 MG tablet Take 1 tablet (600 mg total) by mouth every 8 (eight) hours as needed for moderate pain. 03/15/16  Yes Noreene Filbert, MD  losartan (COZAAR) 50 MG tablet Take 50 mg by mouth daily.  09/16/14  Yes Historical Provider, MD  HYDROcodone-acetaminophen (NORCO/VICODIN) 5-325 MG tablet Take 1-2 tablets by mouth every 6 (six) hours as needed for moderate pain. Patient not taking: Reported on 03/26/2016 12/21/15   Hollice Espy, MD      VITAL SIGNS:  Blood pressure (!) 145/72, pulse 80, temperature 98.2 F (36.8 C), temperature source Oral, resp. rate 18, height 5\' 7"  (1.702 m), weight 45.4 kg (100 lb), SpO2 100 %.  PHYSICAL EXAMINATION:   Physical Exam  Constitutional: He is oriented to person, place, and time. No distress.  Frail, chronically ill-appearing  HENT:  Head: Normocephalic.  Eyes: No scleral icterus.  Neck: Normal range of motion. Neck supple. No JVD present. No tracheal deviation present.  Cardiovascular: Normal rate, regular rhythm and normal heart sounds.  Exam reveals no gallop and no friction rub.   No murmur heard. Pulmonary/Chest: Effort normal and breath sounds normal. No respiratory distress. He has no wheezes. He has no rales. He exhibits no tenderness.  Abdominal: Soft. Bowel sounds are normal. He exhibits no distension and no mass. There is no tenderness. There is no rebound and no guarding.  Musculoskeletal: Normal range of motion. He exhibits no edema.  Neurological: He is alert and oriented to person, place, and time.  Skin: Skin is warm. No rash noted. No erythema.  Psychiatric: Affect and judgment normal.      LABORATORY PANEL:   CBC  Recent Labs Lab 03/26/16 1233  WBC 14.2*  HGB 9.8*  HCT 30.1*  PLT 395    ------------------------------------------------------------------------------------------------------------------  Chemistries   Recent Labs Lab 03/26/16 1233  NA 139  K 2.5*  CL 95*  CO2 30  GLUCOSE 137*  BUN 19  CREATININE 0.96  CALCIUM 8.2*   ------------------------------------------------------------------------------------------------------------------  Cardiac Enzymes  Recent Labs Lab 03/26/16 1233  TROPONINI 0.10*   ------------------------------------------------------------------------------------------------------------------  RADIOLOGY:  Dg Chest 2 View  Result Date: 03/26/2016 CLINICAL DATA:  Weakness.  Smoker. EXAM: CHEST  2 VIEW COMPARISON:  October 02, 2014 FINDINGS: Hyperinflation of the lungs persists. Mild increased interstitial markings, unchanged, possibly due to smoking related change. No suspicious nodule or mass. No focal infiltrate. Minimal blunting of the left costophrenic angle is stable consistent with pleural thickening versus a tiny effusion. IMPRESSION: No acute interval change. Hyperinflation of the lungs consistent with emphysema again identified. Electronically Signed   By: Dorise Bullion III M.D   On: 03/26/2016 13:11    EKG:   Multiple PVCs   IMPRESSION AND PLAN:   83 year old male with bladder cancer recently finished radiation therapy here with weakness  found to have severe hypokalemia and associated PVCs.  1.Severe hypokalemia with PVCs/nonsustained V. tach:   Start potassium repletion. Check magnesium and phosphorus level Check echocardiogram Cardiology consultation, he may need  beta blocker if he continues to have PVCs after electrolyte repletion.  2. Elevated troponin in the setting of severe hypokalemia and PVCs [place] troponins Continue telemetry monitoring Cardiology consult  3. BPH: Continue finasteride  4. Essential hypertension: Continue Norvasc and losartan  5. History gout: Continue allopurinol  6. History  of bladder cancer: Patient will follow-up with his urologist and has completed radiation therapy.  7. Tobacco dependence: Patient highly encouraged to stop smoking. Patient counseled 3 minutes. 8. Protein calorie malnutrition: Dietary consult All the records are reviewed and case discussed with ED provider. Management plans discussed with the patient and he is  in agreement  CODE STATUS: FULL  CRITICAL CARE TOTAL TIME TAKING CARE OF THIS PATIENT: 50 minutes.    Kimla Furth M.D on 03/26/2016 at 3:24 PM  Between 7am to 6pm - Pager - [phone]  After 6pm go to [email or web address] - password [email or web address] [facility]  Office  [phone]  CC: Primary care physician; [facility]

## 2016-03-26 NOTE — ED Provider Notes (Signed)
[facility] Emergency Department Provider Note   ____________________________________________    I have reviewed the triage vital signs and the nursing notes.   HISTORY  Chief Complaint Weakness     HPI Akeel Boles is a 83 y.o. male who presents with complaints of diffuse weakness. Patient was in the last several days he has had no energy and felt quite fatigued. He is being treated for bladder cancer, last had radiation treatment late December. He denies fevers or chills. Denies shortness of breath although does report cough. Denies nausea or vomiting. Does report difficulty urinating.   Past Medical History:  Diagnosis Date  . Arthritis   . Bladder cancer ([place])   . COPD (chronic obstructive pulmonary disease) ([place])   . Enlarged prostate   . Gout   . Hypertension   . Peripheral vascular disease ([place])   . Shortness of breath dyspnea     Patient Active Problem List   Diagnosis Date Noted  . Bladder cancer ([place]) 01/06/2016  . H/O cardiovascular disorder 04/09/2014  . CAFL (chronic airflow limitation) ([place]) 07/25/2013  . 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 BP (high blood pressure) 07/25/2013  . Lung mass 07/25/2013  . Chronic uveitis 07/25/2013  . Cigarette smoker 07/25/2013    Past Surgical History:  Procedure Laterality Date  . CATARACT EXTRACTION W/ INTRAOCULAR LENS  IMPLANT, BILATERAL Bilateral   . HAND SURGERY  1978  . TRANSURETHRAL RESECTION OF BLADDER TUMOR WITH MITOMYCIN-C N/A 12/21/2015   Procedure: TRANSURETHRAL RESECTION OF BLADDER TUMOR WITH MITOMYCIN-C;  Surgeon: Hollice Espy, MD;  Location: [facility] ORS;  Service: Urology;  Laterality: N/A;    Prior to Admission medications   Medication Sig Start Date End Date Taking? Authorizing Provider  albuterol (PROVENTIL HFA;VENTOLIN HFA) 108 (90 Base) MCG/ACT inhaler Inhale 2 puffs into the lungs every 6 (six) hours as needed for wheezing or shortness of breath.    Historical Provider, MD    allopurinol (ZYLOPRIM) 100 MG tablet Take 100 mg by mouth 2 (two) times daily.    Historical Provider, MD  amLODipine (NORVASC) 10 MG tablet Take 10 mg by mouth daily.     Historical Provider, MD  Cholecalciferol (VITAMIN D3) 1000 UNITS CAPS Take 1 capsule by mouth daily.     Historical Provider, MD  colchicine 0.6 MG tablet Take 0.6 mg by mouth daily.    Historical Provider, MD  docusate sodium (COLACE) 100 MG capsule Take 1 capsule (100 mg total) by mouth 2 (two) times daily. 12/21/15   Hollice Espy, MD  finasteride (PROSCAR) 5 MG tablet Take 1 tablet (5 mg total) by mouth daily. 10/27/15   Nori Riis, PA-C  HYDROcodone-acetaminophen (NORCO/VICODIN) 5-325 MG tablet Take 1-2 tablets by mouth every 6 (six) hours as needed for moderate pain. Patient not taking: Reported on 01/06/2016 12/21/15   Hollice Espy, MD  ibuprofen (ADVIL,MOTRIN) 600 MG tablet Take 1 tablet (600 mg total) by mouth every 8 (eight) hours as needed for moderate pain. 03/15/16   Noreene Filbert, MD  losartan (COZAAR) 50 MG tablet Take 50 mg by mouth daily.  09/16/14   Historical Provider, MD     Allergies Penicillins  Family History  Problem Relation Age of Onset  . Hypertension Mother   . Hypertension Father   . Stroke Sister   . Hypertension Sister   . Prostate cancer Neg Hx   . Kidney cancer Neg Hx     Social History Social History  Substance Use Topics  .  Smoking status: Current Every Day Smoker    Packs/day: 0.50    Years: 72.00    Types: Cigarettes  . Smokeless tobacco: Never Used  . Alcohol use No    Review of Systems  Constitutional: No fever/chills  Cardiovascular: Denies chest pain. Respiratory: Denies shortness of breath.Positive cough Gastrointestinal: No abdominal pain.  No nausea, no vomiting.   Genitourinary: Negative for dysuria. Difficulty urinating Musculoskeletal: Negative for back pain. Skin: Negative for rash. Neurological: Negative for headaches  10-point ROS otherwise  negative.  ____________________________________________   PHYSICAL EXAM:  VITAL SIGNS: ED Triage Vitals  Enc Vitals Group     BP 03/26/16 1158 (!) 145/72     Pulse Rate 03/26/16 1158 80     Resp 03/26/16 1158 18     Temp 03/26/16 1158 98.2 F (36.8 C)     Temp Source 03/26/16 1158 Oral     SpO2 03/26/16 1158 100 %     Weight 03/26/16 1158 100 lb (45.4 kg)     Height 03/26/16 1158 5\' 7"  (1.702 m)     Head Circumference --      Peak Flow --      Pain Score 03/26/16 1159 10     Pain Loc --      Pain Edu? --      Excl. in [place]? --     Constitutional: Alert and oriented. No acute distress.  Eyes: Conjunctivae are normal.   Nose: No congestion/rhinnorhea. Mouth/Throat: Mucous membranes are moist.    Cardiovascular: Normal rate, regular rhythm. Grossly normal heart sounds.  Good peripheral circulation. Respiratory: Normal respiratory effort.  No retractions. Lungs CTAB. Gastrointestinal: Soft and nontender. No distention.  No CVA tenderness. Genitourinary: deferred Musculoskeletal:  Warm and well perfused Neurologic:  Normal speech and language. No gross focal neurologic deficits are appreciated.  Skin:  Skin is warm, dry and intact. No rash noted. Psychiatric: Mood and affect are normal. Speech and behavior are normal.  ____________________________________________   LABS (all labs ordered are listed, but only abnormal results are displayed)  Labs Reviewed  BASIC METABOLIC PANEL - Abnormal; Notable for the following:       Result Value   Potassium 2.5 (*)    Chloride 95 (*)    Glucose, Bld 137 (*)    Calcium 8.2 (*)    All other components within normal limits  CBC - Abnormal; Notable for the following:    WBC 14.2 (*)    RBC 3.26 (*)    Hemoglobin 9.8 (*)    HCT 30.1 (*)    RDW 17.8 (*)    All other components within normal limits  TROPONIN I - Abnormal; Notable for the following:    Troponin I 0.10 (*)    All other components within normal limits  URINALYSIS,  COMPLETE (UACMP) WITH MICROSCOPIC  MAGNESIUM  PHOSPHORUS   ____________________________________________  EKG  ED ECG REPORT I, Lavonia Drafts, the attending physician, personally viewed and interpreted this ECG.  Date: 03/26/2016 EKG Time: 12:38 PM  Rhythm: normal sinus rhythm QRS Axis: normal Intervals: normal ST/T Wave abnormalities: Nonspecific changes Conduction Disturbances: none Narrative Interpretation: PVCs  ____________________________________________  RADIOLOGY  Chest x-ray unremarkable ____________________________________________   PROCEDURES  Procedure(s) performed: No    Critical Care performed: No ____________________________________________   INITIAL IMPRESSION / ASSESSMENT AND PLAN / ED COURSE  Pertinent labs & imaging results that were available during my care of the patient were reviewed by me and considered in my medical decision making (see  chart for details).  Patient presents with diffuse weakness. Overall however he looks relatively well. We will check labs and give iv fluids and reevaluate   Clinical Course   Patient presents with diffuse weakness. This is likely caused by his acute hypokalemia. He also has an elevated troponin but has no chest pain and his EKG does not demonstrate any ST elevation. patient does have brief runs of PVCs, we will replete potassium and admitted to the hospital service for further evaluation ____________________________________________   FINAL CLINICAL IMPRESSION(S) / ED DIAGNOSES  Final diagnoses:  Weakness  Hypokalemia      NEW MEDICATIONS STARTED DURING THIS VISIT:  New Prescriptions   No medications on file     Note:  This document was prepared using Dragon voice recognition software and may include unintentional dictation errors.    Lavonia Drafts, MD 03/26/16 1459

## 2016-03-26 NOTE — ED Notes (Signed)
Patient states that he has been feeling bad for the past few weeks. Patient states that he feels weaker than normal. Patient had radiation for bladder cancer on December 27th.

## 2016-03-26 NOTE — Progress Notes (Signed)
Family Meeting Note  Advance Directive:no  Today a meeting took place with the Patient.and family     The following clinical team members were present during this meeting:MD  The following were discussed:Patient's diagnosis: severe hypokalemai  PVC NSVT Bladder cancer, Patient's progosis: Unable to determine and Goals for treatment: Full Code  Additional follow-up to be provided: nneds advance directives set up  Time spent during discussion:15 minutes  Nathaniel Fluckiger, MD

## 2016-03-26 NOTE — Consult Note (Signed)
MEDICATION RELATED CONSULT NOTE - INITIAL   Pharmacy Consult for electrolytes Indication: hypokalemia, hypomagnesium  Allergies  Allergen Reactions  . Penicillins Rash and Other (See Comments)    Patient Measurements: Height: 5\' 7"  (170.2 cm) Weight: 100 lb (45.4 kg) IBW/kg (Calculated) : 66.1 Adjusted Body Weight:   Vital Signs: Temp: 98.1 F (36.7 C) (01/06 1705) Temp Source: Oral (01/06 1705) BP: 120/72 (01/06 1705) Pulse Rate: 25 (01/06 1705) Intake/Output from previous day: No intake/output data recorded. Intake/Output from this shift: Total I/O In: 100 [IV Piggyback:100] Out: -   Labs:  Recent Labs  03/26/16 1233  WBC 14.2*  HGB 9.8*  HCT 30.1*  PLT 395  CREATININE 0.96  MG 1.1*  PHOS 2.5   Estimated Creatinine Clearance: 38.8 mL/min (by C-G formula based on SCr of 0.96 mg/dL).   Microbiology: Recent Results (from the past 720 hour(s))  Urine culture     Status: Abnormal   Collection Time: 03/03/16  2:07 PM  Result Value Ref Range Status   Specimen Description URINE, RANDOM  Final   Special Requests NONE  Final   Culture MULTIPLE SPECIES PRESENT, SUGGEST RECOLLECTION (A)  Final   Report Status 03/05/2016 FINAL  Final    Medical History: Past Medical History:  Diagnosis Date  . Arthritis   . Bladder cancer ([place])   . COPD (chronic obstructive pulmonary disease) ([place])   . Enlarged prostate   . Gout   . Hypertension   . Peripheral vascular disease ([place])   . Shortness of breath dyspnea     Medications:  Scheduled:  . allopurinol  100 mg Oral BID  . [START ON 03/27/2016] amLODipine  10 mg Oral Daily  . [START ON 03/27/2016] cholecalciferol  1,000 Units Oral Daily  . docusate sodium  100 mg Oral BID  . enoxaparin (LOVENOX) injection  40 mg Subcutaneous Q24H  . [START ON 03/27/2016] finasteride  5 mg Oral Daily  . [START ON 03/27/2016] Influenza vac split quadrivalent PF  0.5 mL Intramuscular Tomorrow-1000  . [START ON 03/27/2016] losartan  50 mg Oral  Daily  . magnesium sulfate 1 - 4 g bolus IVPB  4 g Intravenous STAT  . sodium chloride flush  3 mL Intravenous Q12H    Assessment: Pt is an 83 year old male who presents with weakness. Pt had diarrhea yesterday, resolved today. Pt found to have a K of 2.5 with PVC. Mg=1.1, phos level WNL. Pt received 40 MEQ po of KCL in the ER. Pharmacy consulted to monitor/replace electrolytes  Goal of Therapy:  Normalization of electrolytes  Plan:  KCL 40 MEQ po already given. Will give Mg 4g IV once, KCL 80 MEQ in 1024ml NS @ 166ml/hr x 1. Recheck 1 hr after infusion has ended. Maintenance fluids w/ 20 MEQ of KCL have been ordered and will being after the bag of 80 MEQ is finished infusing.  Ramond Dial, Pharm.D, BCPS Clinical Pharmacist  03/26/2016,6:06 PM

## 2016-03-27 ENCOUNTER — Inpatient Hospital Stay
Admit: 2016-03-27 | Discharge: 2016-03-27 | Disposition: A | Discharge status: Home / Self Care | Payer: Medicare Other | Attending: Internal Medicine | Admitting: Internal Medicine

## 2016-03-27 DIAGNOSIS — E43 Unspecified severe protein-calorie malnutrition: Secondary | ICD-10-CM

## 2016-03-27 DIAGNOSIS — L8990 Pressure ulcer of unspecified site, unspecified stage: Secondary | ICD-10-CM | POA: Insufficient documentation

## 2016-03-27 DIAGNOSIS — R197 Diarrhea, unspecified: Secondary | ICD-10-CM

## 2016-03-27 DIAGNOSIS — R531 Weakness: Secondary | ICD-10-CM

## 2016-03-27 DIAGNOSIS — J410 Simple chronic bronchitis: Secondary | ICD-10-CM

## 2016-03-27 DIAGNOSIS — I493 Ventricular premature depolarization: Secondary | ICD-10-CM

## 2016-03-27 DIAGNOSIS — E876 Hypokalemia: Secondary | ICD-10-CM

## 2016-03-27 LAB — BASIC METABOLIC PANEL
ANION GAP: 6 (ref 5–15)
BUN: 14 mg/dL (ref 6–20)
CO2: 30 mmol/L (ref 22–32)
Calcium: 7.8 mg/dL — ABNORMAL LOW (ref 8.9–10.3)
Chloride: 105 mmol/L (ref 101–111)
Creatinine, Ser: 0.8 mg/dL (ref 0.61–1.24)
GFR calc Af Amer: >60 mL/min (ref >60)
GLUCOSE: 139 mg/dL — AB (ref 65–99)
POTASSIUM: 3.3 mmol/L — AB (ref 3.5–5.1)
Sodium: 141 mmol/L (ref 135–145)

## 2016-03-27 LAB — HEPATIC FUNCTION PANEL
ALBUMIN: 2.5 g/dL — AB (ref 3.5–5.0)
ALT: 11 U/L — ABNORMAL LOW (ref 17–63)
AST: 29 U/L (ref 15–41)
Alkaline Phosphatase: 80 U/L (ref 38–126)
Bilirubin, Direct: <0.1 mg/dL — ABNORMAL LOW (ref 0.1–0.5)
TOTAL PROTEIN: 7.3 g/dL (ref 6.5–8.1)
Total Bilirubin: 0.3 mg/dL (ref 0.3–1.2)

## 2016-03-27 LAB — CBC
HEMATOCRIT: 25.8 % — AB (ref 40.0–52.0)
HEMOGLOBIN: 8.4 g/dL — AB (ref 13.0–18.0)
MCH: 30.6 pg (ref 26.0–34.0)
MCHC: 32.7 g/dL (ref 32.0–36.0)
MCV: 93.7 fL (ref 80.0–100.0)
Platelets: 335 10*3/uL (ref 150–440)
RBC: 2.75 MIL/uL — AB (ref 4.40–5.90)
RDW: 18 % — ABNORMAL HIGH (ref 11.5–14.5)
WBC: 15.3 10*3/uL — AB (ref 3.8–10.6)

## 2016-03-27 LAB — POTASSIUM
Potassium: 3.3 mmol/L — ABNORMAL LOW (ref 3.5–5.1)
Potassium: 3.7 mmol/L (ref 3.5–5.1)

## 2016-03-27 LAB — MAGNESIUM: Magnesium: 1.9 mg/dL (ref 1.7–2.4)

## 2016-03-27 LAB — TROPONIN I: TROPONIN I: 0.1 ng/mL — AB (ref <0.03)

## 2016-03-27 LAB — PHOSPHORUS: PHOSPHORUS: 2.3 mg/dL — AB (ref 2.5–4.6)

## 2016-03-27 MED ORDER — BUDESONIDE 0.25 MG/2ML IN SUSP
0.2500 mg | Freq: Two times a day (BID) | RESPIRATORY_TRACT | Status: DC
  Administered 2016-03-27 – 2016-03-29 (×5): 0.25 mg via RESPIRATORY_TRACT
  Filled 2016-03-27 (×6): qty 2

## 2016-03-27 MED ORDER — LEVOFLOXACIN IN D5W 500 MG/100ML IV SOLN
500.0000 mg | Freq: Once | INTRAVENOUS | Status: AC
  Administered 2016-03-27: 500 mg via INTRAVENOUS
  Filled 2016-03-27: qty 100

## 2016-03-27 MED ORDER — LEVOFLOXACIN IN D5W 250 MG/50ML IV SOLN
250.0000 mg | INTRAVENOUS | Status: DC
  Administered 2016-03-28 – 2016-03-29 (×2): 250 mg via INTRAVENOUS
  Filled 2016-03-27 (×3): qty 50

## 2016-03-27 MED ORDER — ALBUTEROL SULFATE (2.5 MG/3ML) 0.083% IN NEBU
2.5000 mg | INHALATION_SOLUTION | Freq: Four times a day (QID) | RESPIRATORY_TRACT | Status: DC
  Administered 2016-03-27 – 2016-03-29 (×8): 2.5 mg via RESPIRATORY_TRACT
  Filled 2016-03-27 (×8): qty 3

## 2016-03-27 MED ORDER — POTASSIUM CHLORIDE CRYS ER 20 MEQ PO TBCR
40.0000 meq | EXTENDED_RELEASE_TABLET | Freq: Once | ORAL | Status: AC
  Administered 2016-03-27: 40 meq via ORAL
  Filled 2016-03-27: qty 2

## 2016-03-27 MED ORDER — METHYLPREDNISOLONE SODIUM SUCC 125 MG IJ SOLR
125.0000 mg | Freq: Once | INTRAMUSCULAR | Status: AC
  Administered 2016-03-27: 125 mg via INTRAVENOUS
  Filled 2016-03-27: qty 2

## 2016-03-27 MED ORDER — METHYLPREDNISOLONE SODIUM SUCC 125 MG IJ SOLR: 60.0000 mg | Freq: Four times a day (QID) | INTRAMUSCULAR | Status: DC

## 2016-03-27 MED ORDER — METHYLPREDNISOLONE SODIUM SUCC 125 MG IJ SOLR
60.0000 mg | Freq: Four times a day (QID) | INTRAMUSCULAR | Status: DC
  Administered 2016-03-27 – 2016-03-29 (×8): 60 mg via INTRAVENOUS
  Filled 2016-03-27 (×8): qty 2

## 2016-03-27 MED ORDER — METHYLPREDNISOLONE SODIUM SUCC 125 MG IJ SOLR: 125.0000 mg | Freq: Once | INTRAMUSCULAR | Status: DC

## 2016-03-27 MED ORDER — METOPROLOL TARTRATE 25 MG PO TABS
12.5000 mg | ORAL_TABLET | Freq: Four times a day (QID) | ORAL | Status: DC
  Administered 2016-03-27: 12.5 mg via ORAL
  Filled 2016-03-27: qty 1

## 2016-03-27 MED ORDER — POTASSIUM & SODIUM PHOSPHATES 280-160-250 MG PO PACK
1.0000 | PACK | Freq: Three times a day (TID) | ORAL | Status: DC
  Administered 2016-03-27 (×2): 1 via ORAL
  Filled 2016-03-27 (×2): qty 1

## 2016-03-27 MED ORDER — SODIUM CHLORIDE 0.9 % IV SOLN
Freq: Once | INTRAVENOUS | Status: AC
  Administered 2016-03-27: 09:00:00 via INTRAVENOUS
  Filled 2016-03-27: qty 1000

## 2016-03-27 MED ORDER — MAGNESIUM SULFATE 2 GM/50ML IV SOLN
2.0000 g | Freq: Once | INTRAVENOUS | Status: AC
  Administered 2016-03-27: 2 g via INTRAVENOUS
  Filled 2016-03-27: qty 50

## 2016-03-27 NOTE — Progress Notes (Addendum)
Patient ID: Nathaniel Armstrong, male   DOB: February 21, 1933, 83 y.o.   MRN: [id number]  [employer] PROGRESS NOTE  Nathaniel Armstrong [id number] DOB: [birth year] DOA: 03/26/2016 PCP: [facility]  HPI/Subjective: Patient audibly wheezing when I walked in the room. Using accessory muscles to breathe. Patient feeling weak. He feels a little short of breath.  Objective: Vitals:   03/27/16 0341 03/27/16 0342  BP: (!) 84/73 (!) 135/93  Pulse: 99 (!) 59  Resp: 18   Temp: 97.6 F (36.4 C)     Filed Weights   03/26/16 1158  Weight: 45.4 kg (100 lb)    ROS: Review of Systems  Constitutional: Positive for malaise/fatigue. Negative for chills and fever.  Eyes: Negative for blurred vision.  Respiratory: Positive for shortness of breath and wheezing. Negative for cough.   Cardiovascular: Negative for chest pain.  Gastrointestinal: Negative for abdominal pain, constipation, diarrhea, nausea and vomiting.  Genitourinary: Positive for dysuria.  Musculoskeletal: Negative for joint pain.  Neurological: Negative for dizziness and headaches.   Exam: Physical Exam  Constitutional: He is oriented to person, place, and time.  HENT:  Nose: No mucosal edema.  Mouth/Throat: No oropharyngeal exudate or posterior oropharyngeal edema.  Eyes: Conjunctivae, EOM and lids are normal. Pupils are equal, round, and reactive to light.  Neck: No JVD present. Carotid bruit is not present. No edema present. No thyroid mass and no thyromegaly present.  Cardiovascular: S1 normal and S2 normal.  Exam reveals no gallop.   No murmur heard. Pulses:      Dorsalis pedis pulses are 2+ on the right side, and 2+ on the left side.  Respiratory: Accessory muscle usage present. No respiratory distress. He has decreased breath sounds in the right middle field, the right lower field, the left middle field and the left lower field. He has wheezes in the right middle field, the right lower field, the left middle field and  the left lower field. He has no rhonchi. He has no rales.  GI: Soft. Bowel sounds are normal. There is no tenderness.  Musculoskeletal:       Right ankle: He exhibits no swelling.       Left ankle: He exhibits no swelling.  Lymphadenopathy:    He has no cervical adenopathy.  Neurological: He is alert and oriented to person, place, and time. No cranial nerve deficit.  Skin: Skin is warm. Nails show no clubbing.  Stage II decubiti with redness on the sacrum  Psychiatric: He has a normal mood and affect.      Data Reviewed: Basic Metabolic Panel:  Recent Labs Lab 03/26/16 1233 03/27/16 0456  NA 139 141  K 2.5* 3.3*  3.3*  CL 95* 105  CO2 30 30  GLUCOSE 137* 139*  BUN 19 14  CREATININE 0.96 0.80  CALCIUM 8.2* 7.8*  MG 1.1* 1.9  PHOS 2.5 2.3*   Liver Function Tests:  Recent Labs Lab 03/27/16 0456  AST 29  ALT 11*  ALKPHOS 80  BILITOT 0.3  PROT 7.3  ALBUMIN 2.5*   CBC:  Recent Labs Lab 03/26/16 1233 03/27/16 0456  WBC 14.2* 15.3*  HGB 9.8* 8.4*  HCT 30.1* 25.8*  MCV 92.4 93.7  PLT 395 335   Cardiac Enzymes:  Recent Labs Lab 03/26/16 1233 03/26/16 1724 03/26/16 2252 03/27/16 0456  TROPONINI 0.10* 0.12* 0.10* 0.10*     Studies: Dg Chest 2 View  Result Date: 03/26/2016 CLINICAL DATA:  Weakness.  Smoker. EXAM: CHEST  2 VIEW COMPARISON:  October 02, 2014 FINDINGS: Hyperinflation of the lungs persists. Mild increased interstitial markings, unchanged, possibly due to smoking related change. No suspicious nodule or mass. No focal infiltrate. Minimal blunting of the left costophrenic angle is stable consistent with pleural thickening versus a tiny effusion. IMPRESSION: No acute interval change. Hyperinflation of the lungs consistent with emphysema again identified. Electronically Signed   By: Dorise Bullion III M.D   On: 03/26/2016 13:11    Scheduled Meds: . albuterol  2.5 mg Nebulization Q6H  . allopurinol  100 mg Oral BID  . budesonide (PULMICORT)  nebulizer solution  0.25 mg Nebulization BID  . cholecalciferol  1,000 Units Oral Daily  . docusate sodium  100 mg Oral BID  . enoxaparin (LOVENOX) injection  40 mg Subcutaneous Q24H  . finasteride  5 mg Oral Daily  . Influenza vac split quadrivalent PF  0.5 mL Intramuscular Tomorrow-1000  . levofloxacin (LEVAQUIN) IV  500 mg Intravenous Q24H  . magnesium sulfate 1 - 4 g bolus IVPB  2 g Intravenous Once  . methylPREDNISolone (SOLU-MEDROL) injection  125 mg Intravenous Once  . methylPREDNISolone (SOLU-MEDROL) injection  60 mg Intravenous Q6H  . sodium chloride flush  3 mL Intravenous Q12H   Continuous Infusions: . 0.9 % NaCl with KCl 20 mEq / L 75 mL/hr at 03/27/16 0058    Assessment/Plan:  1. Acute respiratory failure with hypoxia. Continue oxygen supplementation. Patient using accessory muscles to breathe. With audible wheezing. 2. COPD exacerbation. Start stat signed Medrol 125 mg IV 1 and 60 mg IV every 6 hours. Change to standing dose DuoNeb nebulizer solution. Add budesonide nebulizers. Start Levaquin. 3. Acute cystitis with hematuria start Levaquin. Add on urine culture 4. Hypokalemia, hypomagnesemia replace magnesium IV and potassium in IV fluids and orally 5. Weakness. Physical therapy evaluation 6. PVCs, MAT. Unable to give beta blocker at this time secondary to bronchospasm. If blood pressure improves can consider Cardizem 7. Hypotension. Hold antihypertensive medications at this time. 8. Elevated troponin. Demand ischemia from acute respiratory failure and COPD exacerbation 9. Bladder cancer and lung mass. Case discussed with daughter and patient and he would like to be a full code 10. History of gout on allopurinol 11. Anemia of chronic disease watch closely with hydration.  Code Status:     Code Status Orders        Start     Ordered   03/26/16 1702  Full code  Continuous     03/26/16 1701    Code Status History    Date Active Date Inactive Code Status Order ID  Comments User Context   This patient has a current code status but no historical code status.     Family Communication: Daughter at the bedside Disposition Plan: To be determined  Consultants:  Cardiology  Antibiotics:  Levaquin  Time spent: 35 minutes  Loletha Grayer  [employer]

## 2016-03-27 NOTE — Consult Note (Signed)
MEDICATION RELATED CONSULT NOTE - INITIAL   Pharmacy Consult for electrolytes Indication: hypokalemia, hypomagnesium  Allergies  Allergen Reactions  . Penicillins Rash and Other (See Comments)    Patient Measurements: Height: 5\' 7"  (170.2 cm) Weight: 100 lb (45.4 kg) IBW/kg (Calculated) : 66.1 Adjusted Body Weight:   Vital Signs: Temp: 97.6 F (36.4 C) (01/07 0341) Temp Source: Oral (01/07 0341) BP: 135/93 (01/07 0342) Pulse Rate: 59 (01/07 0342) Intake/Output from previous day: 01/06 0701 - 01/07 0700 In: 604.6 [I.V.:504.6; IV Piggyback:100] Out: 100 [Urine:100] Intake/Output from this shift: Total I/O In: 396.3 [I.V.:396.3] Out: 100 [Urine:100]  Labs:  Recent Labs  03/26/16 1233 03/27/16 0456  WBC 14.2* 15.3*  HGB 9.8* 8.4*  HCT 30.1* 25.8*  PLT 395 335  CREATININE 0.96 0.80  MG 1.1* 1.9  PHOS 2.5 2.3*   Estimated Creatinine Clearance: 46.5 mL/min (by C-G formula based on SCr of 0.8 mg/dL).   Microbiology: Recent Results (from the past 720 hour(s))  Urine culture     Status: Abnormal   Collection Time: 03/03/16  2:07 PM  Result Value Ref Range Status   Specimen Description URINE, RANDOM  Final   Special Requests NONE  Final   Culture MULTIPLE SPECIES PRESENT, SUGGEST RECOLLECTION (A)  Final   Report Status 03/05/2016 FINAL  Final    Medical History: Past Medical History:  Diagnosis Date  . Arthritis   . Bladder cancer ([place])   . COPD (chronic obstructive pulmonary disease) ([employer])   . Enlarged prostate   . Gout   . Hypertension   . Peripheral vascular disease ([place])   . Shortness of breath dyspnea     Medications:  Scheduled:  . allopurinol  100 mg Oral BID  . amLODipine  10 mg Oral Daily  . cholecalciferol  1,000 Units Oral Daily  . docusate sodium  100 mg Oral BID  . enoxaparin (LOVENOX) injection  40 mg Subcutaneous Q24H  . finasteride  5 mg Oral Daily  . Influenza vac split quadrivalent PF  0.5 mL Intramuscular Tomorrow-1000  .  losartan  50 mg Oral Daily  . potassium & sodium phosphates  1 packet Oral TID WC & HS  . 0.9 % sodium chloride with kcl   Intravenous Once  . sodium chloride flush  3 mL Intravenous Q12H    Assessment: Pt is an 83 year old male who presents with weakness. Pt had diarrhea yesterday, resolved today. Pt found to have a K of 2.5 with PVC. Mg=1.1, phos level WNL. Pt received 40 MEQ po of KCL in the ER. Pharmacy consulted to monitor/replace electrolytes  Goal of Therapy:  Normalization of electrolytes  Plan:  KCL 40 MEQ po already given. Will give Mg 4g IV once, KCL 80 MEQ in 1059ml NS @ 1110ml/hr x 1. Recheck 1 hr after infusion has ended. Maintenance fluids w/ 20 MEQ of KCL have been ordered and will being after the bag of 80 MEQ is finished infusing.  01/07 0456 K 3.3, Mg 1.9, phos 2.3. Give potassium chloride 80 mEq/1000 mL NS IV x 1, PhosNak powder packet ACHS x 4 doses, recheck BMP this evening and all electrolytes with AM labs.   Claudene Gatliff A. [place], [place].D, BCPS Clinical Pharmacist 03/27/2016,6:19 AM

## 2016-03-27 NOTE — Consult Note (Signed)
Cardiology Consultation Note  Patient ID: Nathaniel Armstrong, MRN: [id number], DOB/AGE: May 13, 1932 83 y.o. Admit date: 03/26/2016   Date of Consult: 03/27/2016 Primary Physician: [facility] Primary Cardiologist: [id number] [phone]) Requesting Physician: Dr. Benjie Karvonen, MD  Chief Complaint: Weakness/diarrhea/malnutrition/failure to thrive Reason for Consult: PVCs  HPI: 83 y.o. male with h/o bladder cancer s/p TURBT and radiation, lung mass, weight loss, COPD with ongoing tobacco abuse since teenage years a 0.5 to 1 pack daily, PVD, HTN, and BPH who presented to [facility] on 1/6 with 4 days of diarrhea, weakness, fatigue, and failure to thrive. Cardiology is consulted for frequent PVCs in the setting of severe electrolyte abnormalities.  Previously seen by Dr. Ubaldo Glassing in 04/2014 for SOB. Prior stress test in 1//2016 was negative for ischemia. No prior cardiac caths. Patient is s/p radiation for his bladder cancer as above, just completing radiation in December. Not a candidate for chemo. Patient has been dealing with profuse diarrhea for the past 4 days with associated weight loss and poor appetite. Neverw with chest pain. Some SOB. Because of his weakness he presented to [facility]. Upon his arrival to [facility] he was noted to have severe electrolyte abnormalities with potassium of 2.5-->3.3, currently receiving further repletion, magnesium 1.1-->1.9, troponin peak of 0.12, hgb 9.8, wbc 14.2. On telemetry he was noted to be in MAT with frequent PVCs in the setting of the above. He was not started on beta blockade. CXR with COPD.     Past Medical History:  Diagnosis Date  . Arthritis   . Bladder cancer ([place])   . COPD (chronic obstructive pulmonary disease) ([place])   . Enlarged prostate   . Gout   . Hypertension   . Peripheral vascular disease ([place])   . Shortness of breath dyspnea       Most Recent Cardiac Studies: Nuclear stress test 03/2014: FINDINGS: Regional wall motion:reveals normal myocardial thickening  and wall  motion. The overall quality of the study is fair. Artifacts noted: no Left ventricular cavity: normal.  Perfusion Analysis:SPECT images demonstrate homogeneous tracer  distribution throughout the myocardium.   Surgical History:  Past Surgical History:  Procedure Laterality Date  . CATARACT EXTRACTION W/ INTRAOCULAR LENS  IMPLANT, BILATERAL Bilateral   . HAND SURGERY  1978  . TRANSURETHRAL RESECTION OF BLADDER TUMOR WITH MITOMYCIN-C N/A 12/21/2015   Procedure: TRANSURETHRAL RESECTION OF BLADDER TUMOR WITH MITOMYCIN-C;  Surgeon: Hollice Espy, MD;  Location: [facility] ORS;  Service: Urology;  Laterality: N/A;     Home Meds: Prior to Admission medications   Medication Sig Start Date End Date Taking? Authorizing Provider  albuterol (PROVENTIL HFA;VENTOLIN HFA) 108 (90 Base) MCG/ACT inhaler Inhale 2 puffs into the lungs every 6 (six) hours as needed for wheezing or shortness of breath.   Yes Historical Provider, MD  allopurinol (ZYLOPRIM) 100 MG tablet Take 100 mg by mouth 2 (two) times daily.   Yes Historical Provider, MD  amLODipine (NORVASC) 10 MG tablet Take 10 mg by mouth daily.    Yes Historical Provider, MD  Cholecalciferol (VITAMIN D3) 1000 UNITS CAPS Take 1 capsule by mouth daily.    Yes Historical Provider, MD  colchicine 0.6 MG tablet Take 0.6 mg by mouth daily as needed.    Yes Historical Provider, MD  docusate sodium (COLACE) 100 MG capsule Take 1 capsule (100 mg total) by mouth 2 (two) times daily. 12/21/15  Yes Hollice Espy, MD  finasteride (PROSCAR) 5 MG tablet Take 1 tablet (5 mg total) by mouth daily. 10/27/15  Yes Shannon A McGowan, PA-C  ibuprofen (ADVIL,MOTRIN) 600 MG tablet Take 1 tablet (600 mg total) by mouth every 8 (eight) hours as needed for moderate pain. 03/15/16  Yes Noreene Filbert, MD  losartan (COZAAR) 50 MG tablet Take 50 mg by mouth daily.  09/16/14  Yes Historical Provider, MD  HYDROcodone-acetaminophen (NORCO/VICODIN) 5-325 MG tablet Take 1-2  tablets by mouth every 6 (six) hours as needed for moderate pain. Patient not taking: Reported on 03/26/2016 12/21/15   Hollice Espy, MD    Inpatient Medications:  . allopurinol  100 mg Oral BID  . amLODipine  10 mg Oral Daily  . cholecalciferol  1,000 Units Oral Daily  . docusate sodium  100 mg Oral BID  . enoxaparin (LOVENOX) injection  40 mg Subcutaneous Q24H  . finasteride  5 mg Oral Daily  . Influenza vac split quadrivalent PF  0.5 mL Intramuscular Tomorrow-1000  . losartan  50 mg Oral Daily  . magnesium sulfate 1 - 4 g bolus IVPB  2 g Intravenous Once  . potassium & sodium phosphates  1 packet Oral TID WC & HS  . 0.9 % sodium chloride with kcl   Intravenous Once  . sodium chloride flush  3 mL Intravenous Q12H   . 0.9 % NaCl with KCl 20 mEq / L 75 mL/hr at 03/27/16 0058    Allergies:  Allergies  Allergen Reactions  . Penicillins Rash and Other (See Comments)    Social History   Social History  . Marital status: Married    Spouse name: N/A  . Number of children: N/A  . Years of education: N/A   Occupational History  . Not on file.   Social History Main Topics  . Smoking status: Current Every Day Smoker    Packs/day: 0.50    Years: 72.00    Types: Cigarettes  . Smokeless tobacco: Never Used  . Alcohol use No  . Drug use: No  . Sexual activity: Not on file   Other Topics Concern  . Not on file   Social History Narrative  . No narrative on file     Family History  Problem Relation Age of Onset  . Hypertension Mother   . Hypertension Father   . Stroke Sister   . Hypertension Sister   . Prostate cancer Neg Hx   . Kidney cancer Neg Hx      Review of Systems: Review of Systems  Constitutional: Positive for malaise/fatigue and weight loss. Negative for chills, diaphoresis and fever.  HENT: Negative for congestion.   Eyes: Negative for discharge and redness.  Respiratory: Negative for cough, hemoptysis, sputum production, shortness of breath and  wheezing.   Cardiovascular: Negative for chest pain, palpitations, orthopnea, claudication, leg swelling and PND.  Gastrointestinal: Negative for abdominal pain, blood in stool, heartburn, melena, nausea and vomiting.  Genitourinary: Negative for hematuria.  Musculoskeletal: Negative for falls and myalgias.  Skin: Negative for rash.  Neurological: Positive for weakness. Negative for dizziness, tingling, tremors, sensory change, speech change, focal weakness and loss of consciousness.  Endo/Heme/Allergies: Does not bruise/bleed easily.  Psychiatric/Behavioral: Negative for substance abuse. The patient is not nervous/anxious.   All other systems reviewed and are negative.   Labs:  Recent Labs  03/26/16 1233 03/26/16 1724 03/26/16 2252 03/27/16 0456  TROPONINI 0.10* 0.12* 0.10* 0.10*   Lab Results  Component Value Date   WBC 15.3 (H) 03/27/2016   HGB 8.4 (L) 03/27/2016   HCT 25.8 (L) 03/27/2016   MCV 93.7  03/27/2016   PLT 335 03/27/2016     Recent Labs Lab 03/27/16 0456  NA 141  K 3.3*  3.3*  CL 105  CO2 30  BUN 14  CREATININE 0.80  CALCIUM 7.8*  GLUCOSE 139*   No results found for: CHOL, HDL, LDLCALC, TRIG No results found for: DDIMER  Radiology/Studies:  Dg Chest 2 View  Result Date: 03/26/2016 CLINICAL DATA:  Weakness.  Smoker. EXAM: CHEST  2 VIEW COMPARISON:  October 02, 2014 FINDINGS: Hyperinflation of the lungs persists. Mild increased interstitial markings, unchanged, possibly due to smoking related change. No suspicious nodule or mass. No focal infiltrate. Minimal blunting of the left costophrenic angle is stable consistent with pleural thickening versus a tiny effusion. IMPRESSION: No acute interval change. Hyperinflation of the lungs consistent with emphysema again identified. Electronically Signed   By: Dorise Bullion III M.D   On: 03/26/2016 13:11   Dg Lumbar Spine 2-3 Views  Result Date: 03/03/2016 CLINICAL DATA:  Right hip pain, currently undergoing  treatment for bladder carcinoma EXAM: LUMBAR SPINE - 2-3 VIEW COMPARISON:  CT abdomen pelvis of 11/11/2015 FINDINGS: The lumbar vertebrae remain in normal alignment. There are diffuse degenerative changes with osteophytes noted. Intervertebral disc spaces are relatively well preserved for age. No compression deformity is seen. The bones are diffusely osteopenic. IMPRESSION: 1. Normal alignment with diffuse degenerative change as noted previously. No acute compression deformity. 2. Osteopenia. Electronically Signed   By: Ivar Drape M.D.   On: 03/03/2016 14:04   Dg Hip Unilat W Or Wo Pelvis 2-3 Views Right  Result Date: 03/03/2016 CLINICAL DATA:  Right hip pain for several weeks, history of bladder carcinoma currently undergone chemotherapy EXAM: DG HIP (WITH OR WITHOUT PELVIS) 2-3V RIGHT COMPARISON:  CT abdomen pelvis of 11/11/2015 FINDINGS: The bones appear somewhat osteopenic. No acute hip fracture is seen. The pelvic rami are intact. Some chondrocalcinosis of the right hip is noted which could indicate CPPD arthropathy. Sclerotic changes in the left femoral head without deformity are typical of avascular necrosis previously described. The SI joints are well corticated. IMPRESSION: 1. No acute fracture. 2. Diffuse osteopenia. 3. AVN of the left femoral head as noted previously. Electronically Signed   By: Ivar Drape M.D.   On: 03/03/2016 14:03    EKG: Interpreted by me showed: MAT, 130 bpm, occasional PVCs, nonspecific st/t changes Telemetry: Interpreted by me showed: currently in MAT with heart rates in the low 100's bpm, frequent PVCs longest run triplet, no NSVT or VT noted  Weights: Filed Weights   03/26/16 1158  Weight: 100 lb (45.4 kg)     Physical Exam: Blood pressure (!) 135/93, pulse (!) 59, temperature 97.6 F (36.4 C), temperature source Oral, resp. rate 18, height 5\' 7"  (1.702 m), weight 100 lb (45.4 kg), SpO2 (!) 87 %. Body mass index is 15.66 kg/m. General: Frail appearing, in  no acute distress. Head: Normocephalic, atraumatic, sclera non-icteric, no xanthomas, nares are without discharge.  Neck: Negative for carotid bruits. JVD not elevated. Lungs: Clear bilaterally to auscultation without wheezes, rales, or rhonchi. Breathing is unlabored. Heart: RRR with S1 S2. No murmurs, rubs, or gallops appreciated. Abdomen: Soft, non-tender, non-distended with normoactive bowel sounds. No hepatomegaly. No rebound/guarding. No obvious abdominal masses. Msk:  Strength and tone appear declined for age. Extremities: No clubbing or cyanosis. No edema. Distal pedal pulses are 2+ and equal bilaterally. Neuro: Alert and oriented X 3. No facial asymmetry. No focal deficit. Moves all extremities spontaneously. Psych:  Responds  to questions appropriately with a normal affect.    Assessment and Plan:  Principal Problem:   Weakness Active Problems:   Diarrhea   Hypokalemia   Hypomagnesemia   Severe protein-calorie malnutrition (HCC)   COPD (chronic obstructive pulmonary disease) (HCC)   Lung mass   Cigarette smoker   Bladder cancer (HCC)   PVC (premature ventricular contraction)    1. Frequent PVCs: -No VT or NSVT noted  -Patient's longest ventricular run is a ventricular triplet  -Likely in the setting of his profound electrolyte abnormalities -Recommend continued repletion of electrolytes until a potassium goal of 4.0 and magnesium goal of 2.0 -Echo pending to assess LVSF and wall motion -Consider outpatient cardiac monitoring to quantify PVCs -Add metoprolol 12.5 mg q 6 hours today (current heart rate in the low 100's bpm) -Stop amlodipine for BP room  2. MAT: -Likely in the setting of his electrolyte abnormalities as above -As above  3. Elevated troponin: -Never with chest pain -Likely supply demand ischemia from the above -Echo as above -Consider outpatient stress test (negative stress test in 03/2014 for SOB)  4.  Weakness/fatigue/malnutrition/deconditioning: -Likely multifactorial including his bladder cancer, tobacco abuse, COPD, poor appetite  -Check LFT to assess albumin  -Per IM  5. Remaining per IM   Signed, Christell Faith, PA-C [facility] Pager: [phone] 03/27/2016, 7:39 AM

## 2016-03-27 NOTE — Progress Notes (Signed)
PHARMACY NOTE:  ANTIMICROBIAL RENAL DOSAGE ADJUSTMENT  Current antimicrobial regimen includes a mismatch between antimicrobial dosage and estimated renal function.  As per policy approved by the Pharmacy & Therapeutics and Medical Executive Committees, the antimicrobial dosage will be adjusted accordingly.  Current antimicrobial dosage:  Levofloxacin 500mg  q 24hr  Indication: UTI vs COPD  Renal Function:  Estimated Creatinine Clearance: 46.5 mL/min (by C-G formula based on SCr of 0.8 mg/dL). []      On intermittent HD, scheduled: []      On CRRT    Antimicrobial dosage has been changed to:  500mg  once then 250mg  q 24hr  Additional comments:   Thank you for allowing pharmacy to be a part of this patient's care.  Ramond Dial, Pharm.D, BCPS Clinical Pharmacist  03/27/2016 12:52 PM

## 2016-03-27 NOTE — Progress Notes (Addendum)
Pt. Has audible wheezing, PRN Neb treatment given. Pt. Asked to cough and deep breath, has weak cough, non-productive. Incentive spirometer given to pt. Instruction on how to use spirometer given. Pt. Was able to get to five hundred on spirometer scale. Goal is 750.

## 2016-03-27 NOTE — Progress Notes (Signed)
PT Cancellation Note  Patient Details Name: Nathaniel Armstrong MRN: [id number] DOB: [birth year]   Cancelled Treatment:    Reason Eval/Treat Not Completed: Patient not medically ready (O2 sats 82% at rest on nasal cannula.)  Will check tomorrow.   Ramond Dial 03/27/2016, 2:43 PM   Mee Hives, PT MS Acute Rehab Dept. Number: [place] [facility] [place]

## 2016-03-27 NOTE — Consult Note (Signed)
MEDICATION RELATED CONSULT NOTE - INITIAL   Pharmacy Consult for electrolytes Indication: hypokalemia, hypomagnesium  Allergies  Allergen Reactions  . Penicillins Rash and Other (See Comments)    Patient Measurements: Height: 5\' 7"  (170.2 cm) Weight: 100 lb (45.4 kg) IBW/kg (Calculated) : 66.1 Adjusted Body Weight:   Vital Signs:   Intake/Output from previous day: 01/06 0701 - 01/07 0700 In: 604.6 [I.V.:504.6; IV Piggyback:100] Out: 100 [Urine:100] Intake/Output from this shift: No intake/output data recorded.  Labs:  Recent Labs  03/26/16 1233 03/27/16 0456  WBC 14.2* 15.3*  HGB 9.8* 8.4*  HCT 30.1* 25.8*  PLT 395 335  CREATININE 0.96 0.80  MG 1.1* 1.9  PHOS 2.5 2.3*  ALBUMIN  --  2.5*  PROT  --  7.3  AST  --  29  ALT  --  11*  ALKPHOS  --  80  BILITOT  --  0.3  BILIDIR  --  <0.1*  IBILI  --  NOT CALCULATED   Estimated Creatinine Clearance: 46.5 mL/min (by C-G formula based on SCr of 0.8 mg/dL).   Microbiology: Recent Results (from the past 720 hour(s))  Urine culture     Status: Abnormal   Collection Time: 03/03/16  2:07 PM  Result Value Ref Range Status   Specimen Description URINE, RANDOM  Final   Special Requests NONE  Final   Culture MULTIPLE SPECIES PRESENT, SUGGEST RECOLLECTION (A)  Final   Report Status 03/05/2016 FINAL  Final    Medical History: Past Medical History:  Diagnosis Date  . Arthritis   . Bladder cancer ([place])   . COPD (chronic obstructive pulmonary disease) ([place])   . Enlarged prostate   . Gout   . Hypertension   . Peripheral vascular disease ([place])   . Shortness of breath dyspnea     Medications:  Scheduled:  . albuterol  2.5 mg Nebulization Q6H  . allopurinol  100 mg Oral BID  . budesonide (PULMICORT) nebulizer solution  0.25 mg Nebulization BID  . cholecalciferol  1,000 Units Oral Daily  . docusate sodium  100 mg Oral BID  . enoxaparin (LOVENOX) injection  40 mg Subcutaneous Q24H  . finasteride  5 mg Oral Daily   . Influenza vac split quadrivalent PF  0.5 mL Intramuscular Tomorrow-1000  . [START ON 03/28/2016] levofloxacin (LEVAQUIN) IV  250 mg Intravenous Q24H  . methylPREDNISolone (SOLU-MEDROL) injection  60 mg Intravenous Q6H  . sodium chloride flush  3 mL Intravenous Q12H    Assessment: Pt is an 83 year old male who presents with weakness. Pt had diarrhea yesterday, resolved today. Pt found to have a K of 2.5 with PVC. Mg=1.1, phos level WNL. Pt received 40 MEQ po of KCL in the ER. Pharmacy consulted to monitor/replace electrolytes  Goal of Therapy:  Normalization of electrolytes K=>4 Mg=>2  Plan:  K=3.7 goal >4. Will give 40 MEQ po once. Recheck all electrolytes in the AM.  Ramond Dial, Pharm.D, BCPS Clinical Pharmacist  03/27/2016,6:53 PM

## 2016-03-28 LAB — BASIC METABOLIC PANEL
Anion gap: 7 (ref 5–15)
BUN: 16 mg/dL (ref 6–20)
CALCIUM: 8.5 mg/dL — AB (ref 8.9–10.3)
CHLORIDE: 108 mmol/L (ref 101–111)
CO2: 29 mmol/L (ref 22–32)
CREATININE: 0.79 mg/dL (ref 0.61–1.24)
GFR calc Af Amer: >60 mL/min (ref >60)
GFR calc non Af Amer: >60 mL/min (ref >60)
Glucose, Bld: 135 mg/dL — ABNORMAL HIGH (ref 65–99)
Potassium: 5 mmol/L (ref 3.5–5.1)
Sodium: 144 mmol/L (ref 135–145)

## 2016-03-28 LAB — CBC
HCT: 26.9 % — ABNORMAL LOW (ref 40.0–52.0)
Hemoglobin: 8.8 g/dL — ABNORMAL LOW (ref 13.0–18.0)
MCH: 30.8 pg (ref 26.0–34.0)
MCHC: 32.8 g/dL (ref 32.0–36.0)
MCV: 93.7 fL (ref 80.0–100.0)
PLATELETS: 321 10*3/uL (ref 150–440)
RBC: 2.87 MIL/uL — ABNORMAL LOW (ref 4.40–5.90)
RDW: 18 % — AB (ref 11.5–14.5)
WBC: 14.1 10*3/uL — ABNORMAL HIGH (ref 3.8–10.6)

## 2016-03-28 LAB — ECHOCARDIOGRAM COMPLETE
Height: 67 in
Weight: 1600 oz

## 2016-03-28 LAB — PHOSPHORUS: Phosphorus: 2.9 mg/dL (ref 2.5–4.6)

## 2016-03-28 LAB — MAGNESIUM: MAGNESIUM: 1.7 mg/dL (ref 1.7–2.4)

## 2016-03-28 MED ORDER — ALBUTEROL SULFATE (2.5 MG/3ML) 0.083% IN NEBU
2.5000 mg | INHALATION_SOLUTION | RESPIRATORY_TRACT | Status: DC | PRN
  Administered 2016-03-28 – 2016-03-29 (×3): 2.5 mg via RESPIRATORY_TRACT
  Filled 2016-03-28 (×3): qty 3

## 2016-03-28 NOTE — Progress Notes (Signed)
Patient ID: Nathaniel Armstrong, male   DOB: [birth year], 83 y.o.   MRN: [id number]  [employer] PROGRESS NOTE  Nathaniel Armstrong [id number] DOB: Jun 01, 1932 DOA: 03/26/2016 PCP: [place]  HPI/Subjective: Wants to go home but quite wheezy  Objective: Vitals:   03/28/16 1118 03/28/16 1956  BP: 123/67 134/70  Pulse: 90 (!) 129  Resp: 18 18  Temp: 97.3 F (36.3 C) 97.5 F (36.4 C)    Filed Weights   03/26/16 1158  Weight: 45.4 kg (100 lb)    ROS: Review of Systems  Constitutional: Positive for malaise/fatigue. Negative for chills and fever.  Eyes: Negative for blurred vision.  Respiratory: Positive for shortness of breath and wheezing. Negative for cough.   Cardiovascular: Negative for chest pain.  Gastrointestinal: Negative for abdominal pain, constipation, diarrhea, nausea and vomiting.  Genitourinary: Positive for dysuria.  Musculoskeletal: Negative for joint pain.  Neurological: Negative for dizziness and headaches.   Exam: Physical Exam  Constitutional: He is oriented to person, place, and time.  HENT:  Nose: No mucosal edema.  Mouth/Throat: No oropharyngeal exudate or posterior oropharyngeal edema.  Eyes: Conjunctivae, EOM and lids are normal. Pupils are equal, round, and reactive to light.  Neck: No JVD present. Carotid bruit is not present. No edema present. No thyroid mass and no thyromegaly present.  Cardiovascular: S1 normal and S2 normal.  Exam reveals no gallop.   No murmur heard. Pulses:      Dorsalis pedis pulses are 2+ on the right side, and 2+ on the left side.  Respiratory: Accessory muscle usage present. No respiratory distress. He has decreased breath sounds in the right middle field, the right lower field, the left middle field and the left lower field. He has wheezes in the right middle field, the right lower field, the left middle field and the left lower field. He has no rhonchi. He has no rales.  GI: Soft. Bowel sounds are normal.  There is no tenderness.  Musculoskeletal:       Right ankle: He exhibits no swelling.       Left ankle: He exhibits no swelling.  Lymphadenopathy:    He has no cervical adenopathy.  Neurological: He is alert and oriented to person, place, and time. No cranial nerve deficit.  Skin: Skin is warm. Nails show no clubbing.  Stage II decubiti with redness on the sacrum  Psychiatric: He has a normal mood and affect.      Data Reviewed: Basic Metabolic Panel:  Recent Labs Lab 03/26/16 1233 03/27/16 0456 03/27/16 1734 03/28/16 0446  NA 139 141  --  144  K 2.5* 3.3*  3.3* 3.7 5.0  CL 95* 105  --  108  CO2 30 30  --  29  GLUCOSE 137* 139*  --  135*  BUN 19 14  --  16  CREATININE 0.96 0.80  --  0.79  CALCIUM 8.2* 7.8*  --  8.5*  MG 1.1* 1.9  --  1.7  PHOS 2.5 2.3*  --  2.9   Liver Function Tests:  Recent Labs Lab 03/27/16 0456  AST 29  ALT 11*  ALKPHOS 80  BILITOT 0.3  PROT 7.3  ALBUMIN 2.5*   CBC:  Recent Labs Lab 03/26/16 1233 03/27/16 0456 03/28/16 0446  WBC 14.2* 15.3* 14.1*  HGB 9.8* 8.4* 8.8*  HCT 30.1* 25.8* 26.9*  MCV 92.4 93.7 93.7  PLT 395 335 321   Cardiac Enzymes:  Recent Labs Lab 03/26/16 1233 03/26/16 1724 03/26/16 2252  03/27/16 0456  TROPONINI 0.10* 0.12* 0.10* 0.10*     Studies: No results found.  Scheduled Meds: . albuterol  2.5 mg Nebulization Q6H  . allopurinol  100 mg Oral BID  . budesonide (PULMICORT) nebulizer solution  0.25 mg Nebulization BID  . cholecalciferol  1,000 Units Oral Daily  . docusate sodium  100 mg Oral BID  . enoxaparin (LOVENOX) injection  40 mg Subcutaneous Q24H  . finasteride  5 mg Oral Daily  . Influenza vac split quadrivalent PF  0.5 mL Intramuscular Tomorrow-1000  . levofloxacin (LEVAQUIN) IV  250 mg Intravenous Q24H  . methylPREDNISolone (SOLU-MEDROL) injection  60 mg Intravenous Q6H  . sodium chloride flush  3 mL Intravenous Q12H   Continuous Infusions:   Assessment/Plan:  1. Acute  respiratory failure with hypoxia. Continue oxygen supplementation. Patient using accessory muscles to breathe. Less wheezing today. 2. COPD exacerbation. Continue  soluMedrol 60 mg IV every 6 hours. also DuoNeb nebulizer solution. Add budesonide nebulizers. continue Levaquin. 3. Acute cystitis with hematuria start Levaquin. pending urine culture 4. Hypokalemia, hypomagnesemia: repleted 5. Weakness. Physical therapy evaluation 6. PVCs, MAT. Unable to give beta blocker at this time secondary to bronchospasm. If blood pressure improves can consider Cardizem 7. Hypotension. Hold antihypertensive medications at this time. 8. Elevated troponin. Demand ischemia from acute respiratory failure and COPD exacerbation 9. Bladder cancer and lung mass. Case discussed with daughter and patient and he would like to be a full code 10. History of gout on allopurinol 11. Anemia of chronic disease watch closely with hydration.  Code Status:     Code Status Orders        Start     Ordered   03/26/16 1702  Full code  Continuous     03/26/16 1701    Code Status History    Date Active Date Inactive Code Status Order ID Comments User Context   This patient has a current code status but no historical code status.     Family Communication: Daughter at the bedside Disposition Plan: STR/SNF per PT, SW c/s  Consultants:  Cardiology  Antibiotics:  Levaquin  Time spent: 36 minutes  Danaly Bari [employer]

## 2016-03-28 NOTE — Progress Notes (Addendum)
Pt. Having labored breaths with expiratory wheezing, pt. Using accessory muscles (ADB).  Dr. Estanislado Pandy paged, new order for PRN neb treatments.

## 2016-03-28 NOTE — Progress Notes (Signed)
Initial Nutrition Assessment  DOCUMENTATION CODES:   Severe malnutrition in context of chronic illness  INTERVENTION:  1. Magic cup TID with meals, each supplement provides 290 kcal and 9 grams of protein 2. Downgraded patient's diet to NDD2 until SLP can eval.  NUTRITION DIAGNOSIS:   Malnutrition related to chronic illness as evidenced by severe depletion of muscle mass, severe depletion of body fat, percent weight loss.  GOAL:   Patient will meet greater than or equal to 90% of their needs  MONITOR:   PO intake, I & O's, Supplement acceptance, Labs, Weight trends  REASON FOR ASSESSMENT:   Consult, Malnutrition Screening Tool Assessment of nutrition requirement/status  ASSESSMENT:   Nathaniel Armstrong  is a 83 y.o. male with a known history of Bladder cancer who completed radiation at the end of December who presents today with his family due to above complaint. Over the past week patient has had increasing generalized weakness  Spoke with Mr. Burow, family at bedside. They state he was not eating much PTA - this is indicated by his 30#/23% severe wt loss over 1.75 months. I asked patient if foods have been tasting funny or strange, he said he was unsure. Appears things simply aren't tasting good to him. As I was in the room, RN provided patient with water to swallow pills, appeared patient aspirated.  She also states patient has been refusing food. Family denies any history of chewing/swallowing problems but patient is missing many teeth. Denies nausea/vomiting. Awaiting SLP eval - may require thickened liquids.  Nutrition-Focused physical exam completed. Findings are severe fat depletion, moderate-severe muscle depletion, and no edema.   Labs and medications: Vitamin D, Colace, Solumedrol NS w/ KCL 4mEq @ 20mL/hr  Diet Order:  DIET DYS 2 Room service appropriate? Yes; Fluid consistency: Thin  Skin:  Wound (see comment) (Stg II to sacrum)  Last BM:  03/26/2016  Height:    Ht Readings from Last 1 Encounters:  03/26/16 5\' 7"  (1.702 m)    Weight:   Wt Readings from Last 1 Encounters:  03/26/16 100 lb (45.4 kg)    Ideal Body Weight:  67.27 kg  BMI:  Body mass index is 15.66 kg/m.  Estimated Nutritional Needs:   Kcal:  1575-1800 calories (35-40 cal/kg)  Protein:  59-77gm (1.3-1.7gm/kg)  Fluid:  >/= 1.6L  EDUCATION NEEDS:   No education needs identified at this time  Eshaan Kitzmann. Shanasia Ibrahim, MS, RD LDN Inpatient Clinical Dietitian Pager [phone]

## 2016-03-28 NOTE — Evaluation (Signed)
Physical Therapy Evaluation Patient Details Name: Nathaniel Armstrong MRN: [id number] DOB: 08/10/1932 Today's Date: 03/28/2016   History of Present Illness  Pt is a pleasant 83 y/o male admitted due to weakness and labored breathing. Patient is currently on O2 at 2L/min with 94% O2 sat. Per hospitalist patient has a history of; bladder cancer (dec 2017), Arthritis, COPD, Gout, HTN and PVC's. Patient presented with elevated troponin but was due to supply demand ischemia.  Clinical Impression  Patient stated he was feeling "okay" this morning and willing to participate in a PT evaluation. We were unable to obtain an accurate O2 sat, but nursing stated he was stable enough to participate in PT (O2 was 91%). Overall the patient's UE and LE strength was [facility]. Patient required minimal assistance for bed mobility and stated he felt light headed after moving to sitting. Patient transferred to bedside chair with min assist and utilized a RW for additional stability. During transfers the patient displayed labored breathing and stated the activity was "very difficult". He required a resting break of 5 minutes to return to baseline, and decrease use of accessory breathing muscles. PT increased his O2 consumption from 2l/min to 3l/min to help maintain normal O2 sat. The patient presents with decreased activity tolerance due to poor O2 consumption and will benefit from skilled PT to improve overall mobility.     Follow Up Recommendations SNF    Equipment Recommendations  Rolling walker with 5" wheels    Recommendations for Other Services       Precautions / Restrictions Precautions Precautions: Fall Restrictions Weight Bearing Restrictions: No      Mobility  Bed Mobility Overal bed mobility: Needs Assistance Bed Mobility: Supine to Sit     Supine to sit: Min assist     General bed mobility comments: Required use of hand rail and verbal cues to move to edge of bed  Transfers Overall transfer level:  Needs assistance Equipment used: Rolling walker (2 wheeled) Transfers: Sit to/from Stand Sit to Stand: Min assist         General transfer comment: Required use of hands and min assist from PT to stand  Ambulation/Gait Ambulation/Gait assistance: Min assist Ambulation Distance (Feet): 3 Feet Assistive device: Rolling walker (2 wheeled) Gait Pattern/deviations: Trunk flexed;Decreased stride length;Step-to pattern     General Gait Details: Patient was hesistant to take steps and displayed incontinence during ambulation. He took short steps and displayed labored breathing requiring the use of accessory muscles  Stairs            Wheelchair Mobility    Modified Rankin (Stroke Patients Only)       Balance Overall balance assessment: Needs assistance Sitting-balance support: Single extremity supported Sitting balance-Leahy Scale: Fair     Standing balance support: Bilateral upper extremity supported Standing balance-Leahy Scale: Fair                               Pertinent Vitals/Pain Pain Assessment: No/denies pain    Home Living Family/patient expects to be discharged to:: Private residence Living Arrangements: Spouse/significant other   Type of Home: Mobile home Home Access: Stairs to enter Entrance Stairs-Rails: Can reach both Entrance Stairs-Number of Steps: 4-5 Home Layout: One level Home Equipment: Cane - single point;Walker - 2 wheels      Prior Function Level of Independence: Independent with assistive device(s)         Comments: Pt is a limited household ambulator  Hand Dominance        Extremity/Trunk Assessment   Upper Extremity Assessment Upper Extremity Assessment: Overall WFL for tasks assessed (grossly 4+/5)    Lower Extremity Assessment Lower Extremity Assessment: Overall WFL for tasks assessed (Grossly 4/5)       Communication   Communication: No difficulties (has some difficulty hearing)  Cognition  Arousal/Alertness: Awake/alert Behavior During Therapy: WFL for tasks assessed/performed Overall Cognitive Status: Within Functional Limits for tasks assessed                      General Comments      Exercises     Assessment/Plan    PT Assessment Patient needs continued PT services  PT Problem List Decreased mobility;Decreased strength;Decreased activity tolerance;Decreased balance;Cardiopulmonary status limiting activity          PT Treatment Interventions Gait training;Stair training;Functional mobility training;Balance training;Therapeutic exercise;Therapeutic activities;Patient/family education;DME instruction    PT Goals (Current goals can be found in the Care Plan section)  Acute Rehab PT Goals Patient Stated Goal: Return home PT Goal Formulation: With patient Time For Goal Achievement: 04/11/16 Potential to Achieve Goals: Fair    Frequency Min 2X/week   Barriers to discharge Inaccessible home environment Patient requires supplemental O2     Co-evaluation               End of Session Equipment Utilized During Treatment: Gait belt;Oxygen Activity Tolerance: Patient limited by fatigue Patient left: in chair;with chair alarm set;with call bell/phone within reach Nurse Communication: Mobility status (Nurse notified of incontinence issues)         Time: [id number] PT Time Calculation (min) (ACUTE ONLY): 31 min   Charges:         PT G Codes:        Carmelia Tiner SPT  03/28/2016, 10:52 AM

## 2016-03-28 NOTE — Consult Note (Signed)
MEDICATION RELATED CONSULT NOTE - INITIAL   Pharmacy Consult for electrolytes Indication: hypokalemia, hypomagnesium  Allergies  Allergen Reactions  . Penicillins Rash and Other (See Comments)    Patient Measurements: Height: 5\' 7"  (170.2 cm) Weight: 100 lb (45.4 kg) IBW/kg (Calculated) : 66.1 Adjusted Body Weight:   Vital Signs: Temp: 97.3 F (36.3 C) (01/08 1118) Temp Source: Oral (01/08 1118) BP: 123/67 (01/08 1118) Pulse Rate: 90 (01/08 1118) Intake/Output from previous day: 01/07 0701 - 01/08 0700 In: 480 [I.V.:480] Out: 150 [Urine:150] Intake/Output from this shift: No intake/output data recorded.  Labs:  Recent Labs  03/26/16 1233 03/27/16 0456 03/28/16 0446  WBC 14.2* 15.3* 14.1*  HGB 9.8* 8.4* 8.8*  HCT 30.1* 25.8* 26.9*  PLT 395 335 321  CREATININE 0.96 0.80 0.79  MG 1.1* 1.9 1.7  PHOS 2.5 2.3* 2.9  ALBUMIN  --  2.5*  --   PROT  --  7.3  --   AST  --  29  --   ALT  --  11*  --   ALKPHOS  --  80  --   BILITOT  --  0.3  --   BILIDIR  --  <0.1*  --   IBILI  --  NOT CALCULATED  --    Estimated Creatinine Clearance: 46.5 mL/min (by C-G formula based on SCr of 0.79 mg/dL).   Microbiology: Recent Results (from the past 720 hour(s))  Urine culture     Status: Abnormal   Collection Time: 03/03/16  2:07 PM  Result Value Ref Range Status   Specimen Description URINE, RANDOM  Final   Special Requests NONE  Final   Culture MULTIPLE SPECIES PRESENT, SUGGEST RECOLLECTION (A)  Final   Report Status 03/05/2016 FINAL  Final    Medical History: Past Medical History:  Diagnosis Date  . Arthritis   . Bladder cancer ([place])   . COPD (chronic obstructive pulmonary disease) ([place])   . Enlarged prostate   . Gout   . Hypertension   . Peripheral vascular disease ([place])   . Shortness of breath dyspnea     Medications:  Scheduled:  . albuterol  2.5 mg Nebulization Q6H  . allopurinol  100 mg Oral BID  . budesonide (PULMICORT) nebulizer solution  0.25 mg  Nebulization BID  . cholecalciferol  1,000 Units Oral Daily  . docusate sodium  100 mg Oral BID  . enoxaparin (LOVENOX) injection  40 mg Subcutaneous Q24H  . finasteride  5 mg Oral Daily  . Influenza vac split quadrivalent PF  0.5 mL Intramuscular Tomorrow-1000  . levofloxacin (LEVAQUIN) IV  250 mg Intravenous Q24H  . methylPREDNISolone (SOLU-MEDROL) injection  60 mg Intravenous Q6H  . sodium chloride flush  3 mL Intravenous Q12H    Assessment: Pt is an 83 year old male who presents with weakness. Pt had diarrhea yesterday, resolved today. Pt found to have a K of 2.5 with PVC. Mg=1.1, phos level WNL. Pt received 40 MEQ po of KCL in the ER. Pharmacy consulted to monitor/replace electrolytes  1/8 K 5.0, currently ordered NS w/20KCl at 12ml/hr.  Goal of Therapy:  Normalization of electrolytes K=>4 Mg=>2  Plan:  Electrolytes WNL at this time. Will follow up with MD about discontinuing IV fluids or at least removing KCl.  Paulina Fusi, PharmD, BCPS 03/28/2016 1:53 PM

## 2016-03-28 NOTE — Progress Notes (Signed)
Pt. sats 95 on 2LNC, pt's breathing has slowed down, non-productive cough. Resting with eyes closed,  wheezing continues in lungs but is not audible in room at this time. Will continue to monitor,

## 2016-03-28 NOTE — Progress Notes (Signed)
PRN neb treatment given, pt. States its easier to breath.

## 2016-03-28 NOTE — Care Management Important Message (Addendum)
Important Message  Patient Details  Name: Nicklus Dockendorf MRN: [id number] Date of Birth: [birth year]   Medicare Important Message Given:  Yes Initial signed IM printed from Epic and given to patient.    Katrina Stack, RN 03/28/2016, 9:00 AM

## 2016-03-29 ENCOUNTER — Inpatient Hospital Stay: Payer: Medicare Other | Admitting: Hematology and Oncology

## 2016-03-29 ENCOUNTER — Encounter
Admission: RE | Admit: 2016-03-29 | Discharge: 2016-03-29 | Disposition: A | Discharge status: Home / Self Care | Payer: Medicare Other | Source: Ambulatory Visit | Attending: Internal Medicine | Admitting: Internal Medicine

## 2016-03-29 ENCOUNTER — Inpatient Hospital Stay: Payer: Medicare Other

## 2016-03-29 LAB — CBC
HCT: 27.8 % — ABNORMAL LOW (ref 40.0–52.0)
Hemoglobin: 9 g/dL — ABNORMAL LOW (ref 13.0–18.0)
MCH: 30.1 pg (ref 26.0–34.0)
MCHC: 32.5 g/dL (ref 32.0–36.0)
MCV: 92.4 fL (ref 80.0–100.0)
PLATELETS: 336 10*3/uL (ref 150–440)
RBC: 3.01 MIL/uL — ABNORMAL LOW (ref 4.40–5.90)
RDW: 18.1 % — AB (ref 11.5–14.5)
WBC: 12.1 10*3/uL — AB (ref 3.8–10.6)

## 2016-03-29 LAB — BASIC METABOLIC PANEL
ANION GAP: 9 (ref 5–15)
BUN: 19 mg/dL (ref 6–20)
CALCIUM: 9.1 mg/dL (ref 8.9–10.3)
CO2: 33 mmol/L — ABNORMAL HIGH (ref 22–32)
Chloride: 103 mmol/L (ref 101–111)
Creatinine, Ser: 0.73 mg/dL (ref 0.61–1.24)
Glucose, Bld: 152 mg/dL — ABNORMAL HIGH (ref 65–99)
Potassium: 4.3 mmol/L (ref 3.5–5.1)
Sodium: 145 mmol/L (ref 135–145)

## 2016-03-29 MED ORDER — LEVOFLOXACIN 500 MG PO TABS: 500.0000 mg | ORAL_TABLET | Freq: Every day | ORAL | 0 refills | Status: AC

## 2016-03-29 MED ORDER — PREDNISONE 10 MG (21) PO TBPK: 10.0000 mg | ORAL_TABLET | Freq: Every day | ORAL | 0 refills | Status: AC

## 2016-03-29 MED ORDER — FLUTICASONE-SALMETEROL 500-50 MCG/DOSE IN AEPB: 1.0000 | INHALATION_SPRAY | Freq: Two times a day (BID) | RESPIRATORY_TRACT | 0 refills | Status: AC

## 2016-03-29 MED ORDER — TIOTROPIUM BROMIDE MONOHYDRATE 18 MCG IN CAPS: 18.0000 ug | ORAL_CAPSULE | Freq: Every day | RESPIRATORY_TRACT | 1 refills | Status: AC

## 2016-03-29 NOTE — Care Management (Cosign Needed)
Patient presents from home with weakness.  Per his daughter Lelan Pons- patient was ambulating at home with a cane.  He has chronic 02 but does not know the name of the agency that provides it.  He was able to to participate with his bathing with the assist of his wife. Physical therapy is recommending short term skilled nursing facility.  Patient is not committing to this option as he wants to go home.  Per daughter Lelan Pons- if patient can not walk, there are concerns as to whether patient's wife can manage his care. He does not have a walker at home.  Notified csw about potential for skilled nursing.  Attending has stated that patient is medically stable for discharge today.  If patient declines skilled nursing will set up home health PT SN Aide OT and SW.  No agency preference.  Will refer to agency in network with patient's medicare uhc.

## 2016-03-29 NOTE — H&P (Signed)
EMS called for patient transport.

## 2016-03-29 NOTE — Clinical Social Work Placement (Signed)
   CLINICAL SOCIAL WORK PLACEMENT  NOTE  Date:  03/29/2016  Patient Details  Name: Nathaniel Armstrong MRN: [id number] Date of Birth: 1932-05-15  Clinical Social Work is seeking post-discharge placement for this patient at the [place] level of care (*CSW will initial, date and re-position this form in  chart as items are completed):  Yes   Patient/family provided with [place] Work Department's list of facilities offering this level of care within the geographic area requested by the patient (or if unable, by the patient's family).  Yes   Patient/family informed of their freedom to choose among providers that offer the needed level of care, that participate in Medicare, Medicaid or managed care program needed by the patient, have an available bed and are willing to accept the patient.  Yes   Patient/family informed of [place]'s ownership interest in [facility] and [facility], as well as of the fact that they are under no obligation to receive care at these facilities.  PASRR submitted to EDS on 03/29/16     PASRR number received on 03/29/16     Existing PASRR number confirmed on       FL2 transmitted to all facilities in geographic area requested by pt/family on 03/29/16     FL2 transmitted to all facilities within larger geographic area on       Patient informed that his/her managed care company has contracts with or will negotiate with certain facilities, including the following:        Yes   Patient/family informed of bed offers received.  Patient chooses bed at [facility]     Physician recommends and patient chooses bed at      Patient to be transferred to [facility] on 03/29/16.  Patient to be transferred to facility by [facility]     Patient family notified on 03/29/16 of transfer.  Name of family member notified:  daughter Rayveon Hodor     PHYSICIAN Please sign FL2     Additional Comment:     _______________________________________________ Ross Ludwig, LCSWA 03/29/2016, 2:06 PM

## 2016-03-29 NOTE — Clinical Social Work Note (Signed)
Clinical Social Work Assessment  Patient Details  Name: Nathaniel Armstrong MRN: [id number] Date of Birth: Mar 06, 1933  Date of referral:  03/29/16               Reason for consult:  Facility Placement                Permission sought to share information with:  Facility Sport and exercise psychologist, Family Supports Permission granted to share information::  Yes, Verbal Permission Granted  Name::     Dredyn, Cristina [phone] or Kardier, Frink Daughter [phone]   Agency::  SNF admissions  Relationship::     Contact Information:     Housing/Transportation Living arrangements for the past 2 months:  Single Family Home Source of Information:  Adult Children, Spouse Patient Interpreter Needed:  None Criminal Activity/Legal Involvement Pertinent to Current Situation/Hospitalization:  No - Comment as needed Significant Relationships:  Spouse, Adult Children Lives with:  Spouse Do you feel safe going back to the place where you live?  No Need for family participation in patient care:  Yes (Comment)  Care giving concerns: Patient's family feel he needs some short term rehab first before he is able to return back home.   Social Worker assessment / plan:  Patient is an 83 year old male who is married and lives with his wife.  Patient has not been to rehab before, CSW spoke to patient and his family to explain what to expect at SNF and what the process is for a bed search.  CSW explained to patient's family how insurance will pay for his stay and that the goal is to return back home after receiving some therapy.  CSW explained to patient and his family what to expect day of discharge.  Patient's family did not have any other questions and gave CSW permission to begin bed search process in [place].  Employment status:  Retired Nurse, adult PT Recommendations:  [place] / Referral to community resources:  [place]  Patient/Family's Response to care: Patient's family agreeable to going to SNF for short term rehab.  Patient/Family's Understanding of and Emotional Response to Diagnosis, Current Treatment, and Prognosis: Patient's family is understanding of current treatment plan and diagnosis.  Emotional Assessment Appearance:  Appears stated age Attitude/Demeanor/Rapport:    Affect (typically observed):  Appropriate, Calm Orientation:  Oriented to Self, Oriented to Place Alcohol / Substance use:  Not Applicable Psych involvement (Current and /or in the community):  No (Comment)  Discharge Needs  Concerns to be addressed:  Lack of Support Readmission within the last 30 days:  No Current discharge risk:  Lack of support system Barriers to Discharge:  No Barriers Identified   Ross Ludwig, LCSWA 03/29/2016, 2:01 PM

## 2016-03-29 NOTE — Evaluation (Signed)
Clinical/Bedside Swallow Evaluation Patient Details  Name: Nathaniel Armstrong MRN: [id number] Date of Birth: 1932/06/18  Today's Date: 03/29/2016 Time: SLP Start Time (ACUTE ONLY): 0850 SLP Stop Time (ACUTE ONLY): 0930 SLP Time Calculation (min) (ACUTE ONLY): 40 min  Past Medical History:  Past Medical History:  Diagnosis Date  . Arthritis   . Bladder cancer ([place])   . COPD (chronic obstructive pulmonary disease) ([place])   . Enlarged prostate   . Gout   . Hypertension   . Peripheral vascular disease ([place])   . Shortness of breath dyspnea    Past Surgical History:  Past Surgical History:  Procedure Laterality Date  . CATARACT EXTRACTION W/ INTRAOCULAR LENS  IMPLANT, BILATERAL Bilateral   . HAND SURGERY  1978  . TRANSURETHRAL RESECTION OF BLADDER TUMOR WITH MITOMYCIN-C N/A 12/21/2015   Procedure: TRANSURETHRAL RESECTION OF BLADDER TUMOR WITH MITOMYCIN-C;  Surgeon: Hollice Espy, MD;  Location: [facility] ORS;  Service: Urology;  Laterality: N/A;   HPI:  Nathaniel Armstrong  is a 83 y.o. male with a known history of Bladder cancer who completed radiation at the end of December who presents today with his family due to above complaint. Over the past week patient has had increasing generalized weakness. He denies any focal neurological deficits.   Assessment / Plan / Recommendation Clinical Impression  83 year old man, S/P bladder cancer treatment and severe malnutrition, is presenting with reluctance to eat vs. oropharyngeal dysphagia.  He reluctantly took 3 sips each of water and juice with no overt clinical indicators of aspiration.  He had a chest X-ray showing emphysema changes but no focal infiltrate.  The patient is not at significant risk for aspiration.  Recommend that he continue his current diet (dysphagia II with thin liquid) and meds, crushed as able, in apple sauce.  The patient will need encouragement to eat, but should not be force fed.  Recommend SLP swallowing evaluation when he is settled in  his discharge placement.    Aspiration Risk       Diet Recommendation Dysphagia 2 (Fine chop);Thin liquid   Medication Administration: Crushed with puree    Other  Recommendations     Follow up Recommendations        Frequency and Duration            Prognosis Barriers/Prognosis Comment: Post CA treatment and severe malnutrition      Swallow Study   General Date of Onset: 03/26/16 HPI: Nathaniel Armstrong  is a 83 y.o. male with a known history of Bladder cancer who completed radiation at the end of December who presents today with his family due to above complaint. Over the past week patient has had increasing generalized weakness. He denies any focal neurological deficits. Type of Study: Bedside Swallow Evaluation Diet Prior to this Study: Information not available Behavior/Cognition: Uncooperative (Patient reluctant to take POs, consistent with adult FTT)    Oral/Motor/Sensory Function Overall Oral Motor/Sensory Function: Within functional limits   Ice Chips     Thin Liquid Thin Liquid: Within functional limits Presentation: Straw Other Comments: Limited sample, but no overt S/S aspiration    Nectar Thick     Honey Thick     Puree     Solid   GO           Leroy Sea, MS/CCC- SLP  Valetta Fuller, Susie 03/29/2016,9:45 AM

## 2016-03-29 NOTE — Consult Note (Signed)
[place] for electrolytes  Pharmacy consulted for electrolyte management for 83 yo male admitted with COPD exacerbation.     Plan:  No electrolyte replacement warranted at this time. Unless otherwise clinically indicated, will recheck electrolytes with am labs on 1/11.    Allergies  Allergen Reactions  . Penicillins Rash and Other (See Comments)    Patient Measurements: Height: 5\' 7"  (170.2 cm) Weight: 103 lb 4.8 oz (46.9 kg) IBW/kg (Calculated) : 66.1  Vital Signs: Temp: 97.5 F (36.4 C) (01/08 1956) Temp Source: Oral (01/08 1956) BP: 153/130 (01/09 0427) Pulse Rate: 55 (01/09 0647) Intake/Output from previous day: No intake/output data recorded. Intake/Output from this shift: No intake/output data recorded.  Labs:  Recent Labs  03/26/16 1233 03/27/16 0456 03/28/16 0446 03/29/16 0420  WBC 14.2* 15.3* 14.1* 12.1*  HGB 9.8* 8.4* 8.8* 9.0*  HCT 30.1* 25.8* 26.9* 27.8*  PLT 395 335 321 336  CREATININE 0.96 0.80 0.79 0.73  MG 1.1* 1.9 1.7  --   PHOS 2.5 2.3* 2.9  --   ALBUMIN  --  2.5*  --   --   PROT  --  7.3  --   --   AST  --  29  --   --   ALT  --  11*  --   --   ALKPHOS  --  80  --   --   BILITOT  --  0.3  --   --   BILIDIR  --  <0.1*  --   --   IBILI  --  NOT CALCULATED  --   --    Estimated Creatinine Clearance: 48 mL/min (by C-G formula based on SCr of 0.73 mg/dL).   Medical History: Past Medical History:  Diagnosis Date  . Arthritis   . Bladder cancer ([place])   . COPD (chronic obstructive pulmonary disease) ([place])   . Enlarged prostate   . Gout   . Hypertension   . Peripheral vascular disease ([place])   . Shortness of breath dyspnea     Medications:  Scheduled:  . albuterol  2.5 mg Nebulization Q6H  . allopurinol  100 mg Oral BID  . budesonide (PULMICORT) nebulizer solution  0.25 mg Nebulization BID  . cholecalciferol  1,000 Units Oral Daily  . docusate sodium  100 mg Oral BID  . enoxaparin (LOVENOX)  injection  40 mg Subcutaneous Q24H  . finasteride  5 mg Oral Daily  . Influenza vac split quadrivalent PF  0.5 mL Intramuscular Tomorrow-1000  . levofloxacin (LEVAQUIN) IV  250 mg Intravenous Q24H  . methylPREDNISolone (SOLU-MEDROL) injection  60 mg Intravenous Q6H  . sodium chloride flush  3 mL Intravenous Q12H    Pharmacy will continue to monitor and adjust per consult.   MLS 03/29/2016 6:52 AM

## 2016-03-29 NOTE — Progress Notes (Signed)
Nathaniel Armstrong notified me that patient is now going to room 324. Notified patient's wife that patient is leaving by EMS and going to room 324.

## 2016-03-29 NOTE — Discharge Instructions (Signed)
Chronic Obstructive Pulmonary Disease Chronic obstructive pulmonary disease (COPD) is a common lung problem. In COPD, the flow of air from the lungs is limited. The way your lungs work will probably never return to normal, but there are things you can do to improve your lungs and make yourself feel better. Your doctor may treat your condition with:  Medicines.  Oxygen.  Lung surgery.  Changes to your diet.  Rehabilitation. This may involve a team of specialists. Follow these instructions at home:  Take all medicines as told by your doctor.  Avoid medicines or cough syrups that dry up your airway (such as antihistamines) and do not allow you to get rid of thick spit. You do not need to avoid them if told differently by your doctor.  If you smoke, stop. Smoking makes the problem worse.  Avoid being around things that make your breathing worse (like smoke, chemicals, and fumes).  Use oxygen therapy and therapy to help improve your lungs (pulmonary rehabilitation) if told by your doctor. If you need home oxygen therapy, ask your doctor if you should buy a tool to measure your oxygen level (oximeter).  Avoid people who have a sickness you can catch (contagious).  Avoid going outside when it is very hot, cold, or humid.  Eat healthy foods. Eat smaller meals more often. Rest before meals.  Stay active, but remember to also rest.  Make sure to get all the shots (vaccines) your doctor recommends. Ask your doctor if you need a pneumonia shot.  Learn and use tips on how to relax.  Learn and use tips on how to control your breathing as told by your doctor. Try: 1. Breathing in (inhaling) through your nose for 1 second. Then, pucker your lips and breath out (exhale) through your lips for 2 seconds. 2. Putting one hand on your belly (abdomen). Breathe in slowly through your nose for 1 second. Your hand on your belly should move out. Pucker your lips and breathe out slowly through your lips.  Your hand on your belly should move in as you breathe out.  Learn and use controlled coughing to clear thick spit from your lungs. The steps are: 1. Lean your head a little forward. 2. Breathe in deeply. 3. Try to hold your breath for 3 seconds. 4. Keep your mouth slightly open while coughing 2 times. 5. Spit any thick spit out into a tissue. 6. Rest and do the steps again 1 or 2 times as needed. Contact a doctor if:  You cough up more thick spit than usual.  There is a change in the color or thickness of the spit.  It is harder to breathe than usual.  Your breathing is faster than usual. Get help right away if:  You have shortness of breath while resting.  You have shortness of breath that stops you from:  Being able to talk.  Doing normal activities.  You chest hurts for longer than 5 minutes.  Your skin color is more blue than usual.  Your pulse oximeter shows that you have low oxygen for longer than 5 minutes. This information is not intended to replace advice given to you by your health care provider. Make sure you discuss any questions you have with your health care provider. Document Released: 08/24/2007 Document Revised: 08/13/2015 Document Reviewed: 11/01/2012 Elsevier Interactive Patient Education  2017 [employer].  

## 2016-03-29 NOTE — H&P (Signed)
Report called to Sandrea Matte at [facility].

## 2016-03-29 NOTE — Discharge Summary (Signed)
[place] [facility] [place] NAME: Nathaniel Armstrong    MR#:  [id number]  DATE OF BIRTH:  January 26, 1933  DATE OF ADMISSION:  03/26/2016   ADMITTING PHYSICIAN: Bettey Costa, MD  DATE OF DISCHARGE: 03/29/2016  PRIMARY CARE PHYSICIAN: [facility]   ADMISSION DIAGNOSIS:  Hypokalemia [E87.6] Weakness [R53.1] DISCHARGE DIAGNOSIS:  Principal Problem:   Weakness Active Problems:   COPD (chronic obstructive pulmonary disease) (HCC)   Lung mass   Cigarette smoker   Bladder cancer (HCC)   PVC (premature ventricular contraction)   Diarrhea   Hypokalemia   Hypomagnesemia   Severe protein-calorie malnutrition (HCC)   Pressure injury of skin  SECONDARY DIAGNOSIS:   Past Medical History:  Diagnosis Date  . Arthritis   . Bladder cancer ([place])   . COPD (chronic obstructive pulmonary disease) ([place])   . Enlarged prostate   . Gout   . Hypertension   . Peripheral vascular disease ([place])   . Shortness of breath dyspnea    HOSPITAL COURSE:  83 y.o. male with a known history of Bladder cancer who completed radiation at the end of December who was admitted with his family due to above complaint. Over the past week patient has had increasing generalized weakness  1. Acute respiratory failure with hypoxia: due to COPD exacerbation. Improved with steroids and Levaquin. 2. Acute cystitis with hematuria: treated and resolved 3. Hypokalemia, hypomagnesemia: repleted 4. Weakness. Physical therapy recommends STR/SNF - family still deciding 5. PVCs, MAT. Unable to give beta blocker at this time secondary to bronchospasm. If blood pressure improves can consider Cardizem 6. Hypotension. Resolved, can be due to dehydration. 7. Elevated troponin. Demand ischemia from acute respiratory failure and COPD exacerbation 8. Bladder cancer and lung mass. Case discussed with daughter and patient and he would like to be a full code 88. History of gout on  allopurinol 10. Anemia of chronic disease watch closely with hydration.  DISCHARGE CONDITIONS:  stable CONSULTS OBTAINED:   DRUG ALLERGIES:   Allergies  Allergen Reactions  . Penicillins Rash and Other (See Comments)   DISCHARGE MEDICATIONS:   Allergies as of 03/29/2016      Reactions   Penicillins Rash, Other (See Comments)      Medication List    TAKE these medications   albuterol 108 (90 Base) MCG/ACT inhaler Commonly known as:  PROVENTIL HFA;VENTOLIN HFA Inhale 2 puffs into the lungs every 6 (six) hours as needed for wheezing or shortness of breath.   allopurinol 100 MG tablet Commonly known as:  ZYLOPRIM Take 100 mg by mouth 2 (two) times daily.   amLODipine 10 MG tablet Commonly known as:  NORVASC Take 10 mg by mouth daily.   colchicine 0.6 MG tablet Take 0.6 mg by mouth daily as needed.   docusate sodium 100 MG capsule Commonly known as:  COLACE Take 1 capsule (100 mg total) by mouth 2 (two) times daily.   finasteride 5 MG tablet Commonly known as:  PROSCAR Take 1 tablet (5 mg total) by mouth daily.   Fluticasone-Salmeterol 500-50 MCG/DOSE Aepb Commonly known as:  ADVAIR DISKUS Inhale 1 puff into the lungs 2 times daily at 12 noon and 4 pm.   HYDROcodone-acetaminophen 5-325 MG tablet Commonly known as:  NORCO/VICODIN Take 1-2 tablets by mouth every 6 (six) hours as needed for moderate pain.   ibuprofen 600 MG tablet Commonly known as:  ADVIL,MOTRIN Take 1 tablet (600 mg total) by mouth every 8 (eight) hours  as needed for moderate pain.   levofloxacin 500 MG tablet Commonly known as:  LEVAQUIN Take 1 tablet (500 mg total) by mouth daily.   losartan 50 MG tablet Commonly known as:  COZAAR Take 50 mg by mouth daily.   predniSONE 10 MG (21) Tbpk tablet Commonly known as:  STERAPRED UNI-PAK 21 TAB Take 1 tablet (10 mg total) by mouth daily. Start 60 mg daily, taper 10 mg daily until done   tiotropium 18 MCG inhalation capsule Commonly known as:   SPIRIVA HANDIHALER Place 1 capsule (18 mcg total) into inhaler and inhale daily.   Vitamin D3 1000 units Caps Take 1 capsule by mouth daily.      DISCHARGE INSTRUCTIONS:   DIET:  Regular diet DISCHARGE CONDITION:  Fair ACTIVITY:  Activity as tolerated OXYGEN:  Home Oxygen: Yes.    Oxygen Delivery: 2 liters/min via Patient connected to nasal cannula oxygen mainly on ambulation DISCHARGE LOCATION:  nursing home with palliative care to follow - consider Hospice  If you experience worsening of your admission symptoms, develop shortness of breath, life threatening emergency, suicidal or homicidal thoughts you must seek medical attention immediately by calling 911 or calling your MD immediately  if symptoms less severe.  You Must read complete instructions/literature along with all the possible adverse reactions/side effects for all the Medicines you take and that have been prescribed to you. Take any new Medicines after you have completely understood and accpet all the possible adverse reactions/side effects.   Please note  You were cared for by a hospitalist during your hospital stay. If you have any questions about your discharge medications or the care you received while you were in the hospital after you are discharged, you can call the unit and asked to speak with the hospitalist on call if the hospitalist that took care of you is not available. Once you are discharged, your primary care physician will handle any further medical issues. Please note that NO REFILLS for any discharge medications will be authorized once you are discharged, as it is imperative that you return to your primary care physician (or establish a relationship with a primary care physician if you do not have one) for your aftercare needs so that they can reassess your need for medications and monitor your lab values.    On the day of Discharge:  VITAL SIGNS:  Blood pressure (!) 153/130, pulse (!) 55, temperature  97.5 F (36.4 C), temperature source Oral, resp. rate 17, height 5\' 7"  (1.702 m), weight 46.9 kg (103 lb 4.8 oz), SpO2 92 %. PHYSICAL EXAMINATION:  GENERAL:  83 y.o.-year-old patient lying in the bed with no acute distress.  EYES: Pupils equal, round, reactive to light and accommodation. No scleral icterus. Extraocular muscles intact.  HEENT: Head atraumatic, normocephalic. Oropharynx and nasopharynx clear.  NECK:  Supple, no jugular venous distention. No thyroid enlargement, no tenderness.  LUNGS: Normal breath sounds bilaterally, no wheezing, rales,rhonchi or crepitation. No use of accessory muscles of respiration.  CARDIOVASCULAR: S1, S2 normal. No murmurs, rubs, or gallops.  ABDOMEN: Soft, non-tender, non-distended. Bowel sounds present. No organomegaly or mass.  EXTREMITIES: No pedal edema, cyanosis, or clubbing.  NEUROLOGIC: Cranial nerves II through XII are intact. Muscle strength 5/5 in all extremities. Sensation intact. Gait not checked.  PSYCHIATRIC: The patient is alert and oriented x 3.  SKIN: No obvious rash, lesion, or ulcer.  DATA REVIEW:   CBC  Recent Labs Lab 03/29/16 0420  WBC 12.1*  HGB 9.0*  HCT 27.8*  PLT 336    Chemistries   Recent Labs Lab 03/27/16 0456  03/28/16 0446 03/29/16 0420  NA 141  --  144 145  K 3.3*  3.3*  < > 5.0 4.3  CL 105  --  108 103  CO2 30  --  29 33*  GLUCOSE 139*  --  135* 152*  BUN 14  --  16 19  CREATININE 0.80  --  0.79 0.73  CALCIUM 7.8*  --  8.5* 9.1  MG 1.9  --  1.7  --   AST 29  --   --   --   ALT 11*  --   --   --   ALKPHOS 80  --   --   --   BILITOT 0.3  --   --   --   < > = values in this interval not displayed.   Follow-up Information    [facility]. Schedule an appointment as soon as possible for a visit in 1 week(s).   Specialty:  General Practice Contact information: [place] [address] [phone]           Management plans discussed with the patient,  family and they are in agreement.  CODE STATUS: FULL CODE   TOTAL TIME TAKING CARE OF THIS PATIENT: 45 minutes.    Max Sane M.D on 03/29/2016 at 7:56 AM  Between 7am to 6pm - Pager - [phone]  After 6pm go to [email or web address] - Technical brewer [facility]  Office  [phone]  CC: Primary care physician; [facility]   Note: This dictation was prepared with Dragon dictation along with smaller phrase technology. Any transcriptional errors that result from this process are unintentional.

## 2016-03-29 NOTE — NC FL2 (Signed)
[place] LEVEL OF CARE SCREENING TOOL     IDENTIFICATION  Patient Name: Nathaniel Armstrong Birthdate: 1932/05/11 Sex: male Admission Date (Current Location): 03/26/2016  [place] and [place] Number:  Engineering geologist and Address:  [facility], [address], [place], [place] [address]      Provider Number: [id number]  Attending Physician Name and Address:  Max Sane, MD  Relative Name and Phone Number:  [place], [place] [phone] or [facility] Daughter [phone]     Current Level of Care: Hospital Recommended Level of Care: [place] Prior Approval Number:    Date Approved/Denied:   PASRR Number: [id number]  Discharge Plan: SNF    Current Diagnoses: Patient Active Problem List   Diagnosis Date Noted  . Diarrhea 03/27/2016  . Weakness 03/27/2016  . Hypokalemia 03/27/2016  . Hypomagnesemia 03/27/2016  . Severe protein-calorie malnutrition ([place]) 03/27/2016  . Pressure injury of skin 03/27/2016  . PVC (premature ventricular contraction) 03/26/2016  . Bladder cancer ([place]) 01/06/2016  . H/O cardiovascular disorder 04/09/2014  . COPD (chronic obstructive pulmonary disease) ([place]) 07/25/2013  . 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 BP (high blood pressure) 07/25/2013  . Lung mass 07/25/2013  . Chronic uveitis 07/25/2013  . Cigarette smoker 07/25/2013    Orientation RESPIRATION BLADDER Height & Weight     Self  O2 (4L) Continent Weight: 103 lb 4.8 oz (46.9 kg) Height:  5\' 7"  (170.2 cm)  BEHAVIORAL SYMPTOMS/MOOD NEUROLOGICAL BOWEL NUTRITION STATUS      Continent Diet (Cardiac diet)  AMBULATORY STATUS COMMUNICATION OF NEEDS Skin   Limited Assist Verbally PU Stage and Appropriate Care   PU Stage 2 Dressing:  (Change Every 3 days)                   Personal Care Assistance Level of Assistance  Bathing, Feeding, Dressing Bathing Assistance: Limited assistance Feeding assistance: Independent Dressing  Assistance: Limited assistance     Functional Limitations Info  Sight, Hearing, Speech Sight Info: Adequate Hearing Info: Adequate Speech Info: Adequate    SPECIAL CARE FACTORS FREQUENCY  PT (By licensed PT)     PT Frequency: 5x a weeek              Contractures      Additional Factors Info  Code Status, Allergies Code Status Info: Full Code Allergies Info: PENICILLINS            Current Medications (03/29/2016):  This is the current hospital active medication list Current Facility-Administered Medications  Medication Dose Route Frequency Provider Last Rate Last Dose  . acetaminophen (TYLENOL) tablet 650 mg  650 mg Oral Q6H PRN Bettey Costa, MD       Or  . acetaminophen (TYLENOL) suppository 650 mg  650 mg Rectal Q6H PRN Bettey Costa, MD      . albuterol (PROVENTIL) (2.5 MG/3ML) 0.083% nebulizer solution 2.5 mg  2.5 mg Nebulization Q6H Loletha Grayer, MD   2.5 mg at 03/29/16 0743  . albuterol (PROVENTIL) (2.5 MG/3ML) 0.083% nebulizer solution 2.5 mg  2.5 mg Nebulization Q2H PRN Saundra Shelling, MD   2.5 mg at 03/28/16 0604  . allopurinol (ZYLOPRIM) tablet 100 mg  100 mg Oral BID Bettey Costa, MD   100 mg at 03/29/16 0850  . budesonide (PULMICORT) nebulizer solution 0.25 mg  0.25 mg Nebulization BID Loletha Grayer, MD   0.25 mg at 03/29/16 0743  . cholecalciferol (VITAMIN D) tablet 1,000 Units  1,000 Units Oral Daily Bettey Costa, MD  1,000 Units at 03/29/16 0850  . docusate sodium (COLACE) capsule 100 mg  100 mg Oral BID Bettey Costa, MD   100 mg at 03/29/16 0850  . enoxaparin (LOVENOX) injection 40 mg  40 mg Subcutaneous Q24H Bettey Costa, MD   40 mg at 03/28/16 2042  . finasteride (PROSCAR) tablet 5 mg  5 mg Oral Daily Bettey Costa, MD   5 mg at 03/29/16 0850  . hydrALAZINE (APRESOLINE) injection 10 mg  10 mg Intravenous Q6H PRN Bettey Costa, MD      . Influenza vac split quadrivalent PF (FLUARIX) injection 0.5 mL  0.5 mL Intramuscular Tomorrow-1000 Sital Mody, MD      . Levofloxacin  (LEVAQUIN) IVPB 250 mg  250 mg Intravenous Q24H Loletha Grayer, MD   250 mg at 03/29/16 0851  . methylPREDNISolone sodium succinate (SOLU-MEDROL) 125 mg/2 mL injection 60 mg  60 mg Intravenous Q6H Loletha Grayer, MD   60 mg at 03/29/16 0602  . ondansetron (ZOFRAN) tablet 4 mg  4 mg Oral Q6H PRN Bettey Costa, MD       Or  . ondansetron (ZOFRAN) injection 4 mg  4 mg Intravenous Q6H PRN Bettey Costa, MD      . oxyCODONE (Oxy IR/ROXICODONE) immediate release tablet 5 mg  5 mg Oral Q4H PRN Bettey Costa, MD   5 mg at 03/29/16 0601  . senna-docusate (Senokot-S) tablet 1 tablet  1 tablet Oral QHS PRN Bettey Costa, MD      . sodium chloride flush (NS) 0.9 % injection 3 mL  3 mL Intravenous Q12H Bettey Costa, MD   3 mL at 03/29/16 0850     Discharge Medications: Please see discharge summary for a list of discharge medications.  Relevant Imaging Results:  Relevant Lab Results:   Additional Information SSN [id number]  Ross Ludwig, [place]

## 2016-03-29 NOTE — Clinical Social Work Note (Addendum)
Patient to be d/c'ed today to [facility].  Patient and family agreeable to plans will transport via ems RN to call report to room [phone].  Evette Cristal, MSW, [place]

## 2016-03-30 ENCOUNTER — Non-Acute Institutional Stay (SKILLED_NURSING_FACILITY): Payer: Medicare Other | Admitting: Gerontology

## 2016-03-30 DIAGNOSIS — F17210 Nicotine dependence, cigarettes, uncomplicated: Secondary | ICD-10-CM | POA: Diagnosis not present

## 2016-03-30 DIAGNOSIS — J431 Panlobular emphysema: Secondary | ICD-10-CM

## 2016-03-30 LAB — URINE CULTURE

## 2016-04-01 ENCOUNTER — Ambulatory Visit: Payer: Medicare Other

## 2016-04-01 ENCOUNTER — Telehealth: Payer: Self-pay | Admitting: Urology

## 2016-04-01 NOTE — Telephone Encounter (Signed)
Patient's daughter has been notified  Sharyn Lull

## 2016-04-01 NOTE — Telephone Encounter (Signed)
-----   Message from Hollice Espy, MD sent at 03/31/2016 12:55 PM EST ----- Regarding: Needs urology f/u for cystoscopy Can you please arrange for cystoscopy for this patient in Feb 2018?  I don't want him to get lost to follow up.    Hollice Espy, MD  ----- Message ----- From: Lequita Asal, MD Sent: 01/09/2016   3:49 PM To: Hollice Espy, MD

## 2016-04-07 NOTE — Progress Notes (Signed)
Location:      Place of Service:  SNF (31) Provider:  Toni Arthurs, NP-C  [place]  Patient Care Team: [facility] as PCP - General (General Practice)  Extended Emergency Contact Information Primary Emergency Contact: [facility] Address: [address]          [place], [place] [address] [place] Phone: [phone] Relation: Spouse Secondary Emergency Contact: Rolanda Jay [place] Phone: [phone] Relation: Daughter  Code Status:  full Goals of care: Advanced Directive information Advanced Directives 03/26/2016  Does Patient Have a Medical Advance Directive? No  Would patient like information on creating a medical advance directive? No - Patient declined     Chief Complaint  Patient presents with  . Acute Visit    HPI:  Pt is a 83 y.o. male seen today for an acute visit for dyspnea/tachypnea, emphysema. Pt has been having tachypnea and tachycardia. He was restless throughout the night d/t pain and trying to find a comfortable position. His O2 sats dropped this morning while trying to work with therapy. He appears anxious and suspicious of anyone trying to provide care for him. He is cachectic with temporal wasting. He is resistant to answering questions Therefore, I am unable to obtain an accurate and complete ROS.     Past Medical History:  Diagnosis Date  . Arthritis   . Bladder cancer ([place])   . COPD (chronic obstructive pulmonary disease) ([place])   . Enlarged prostate   . Gout   . Hypertension   . Peripheral vascular disease ([place])   . Shortness of breath dyspnea    Past Surgical History:  Procedure Laterality Date  . CATARACT EXTRACTION W/ INTRAOCULAR LENS  IMPLANT, BILATERAL Bilateral   . HAND SURGERY  1978  . TRANSURETHRAL RESECTION OF BLADDER TUMOR WITH MITOMYCIN-C N/A 12/21/2015   Procedure: TRANSURETHRAL RESECTION OF BLADDER TUMOR WITH MITOMYCIN-C;  Surgeon: Hollice Espy, MD;  Location: [facility] ORS;  Service: Urology;  Laterality: N/A;    Allergies  Allergen Reactions  . Penicillins Rash and Other (See Comments)    Allergies as of 03/30/2016      Reactions   Penicillins Rash, Other (See Comments)      Medication List       Accurate as of 03/30/16 11:59 PM. Always use your most recent med list.          albuterol 108 (90 Base) MCG/ACT inhaler Commonly known as:  PROVENTIL HFA;VENTOLIN HFA Inhale 2 puffs into the lungs every 6 (six) hours as needed for wheezing or shortness of breath.   allopurinol 100 MG tablet Commonly known as:  ZYLOPRIM Take 100 mg by mouth 2 (two) times daily.   amLODipine 10 MG tablet Commonly known as:  NORVASC Take 10 mg by mouth daily.   colchicine 0.6 MG tablet Take 0.6 mg by mouth daily as needed.   docusate sodium 100 MG capsule Commonly known as:  COLACE Take 1 capsule (100 mg total) by mouth 2 (two) times daily.   finasteride 5 MG tablet Commonly known as:  PROSCAR Take 1 tablet (5 mg total) by mouth daily.   Fluticasone-Salmeterol 500-50 MCG/DOSE Aepb Commonly known as:  ADVAIR DISKUS Inhale 1 puff into the lungs 2 times daily at 12 noon and 4 pm.   HYDROcodone-acetaminophen 5-325 MG tablet Commonly known as:  NORCO/VICODIN Take 1-2 tablets by mouth every 6 (six) hours as needed for moderate pain.   ibuprofen  600 MG tablet Commonly known as:  ADVIL,MOTRIN Take 1 tablet (600 mg total) by mouth every 8 (eight) hours as needed for moderate pain.   levofloxacin 500 MG tablet Commonly known as:  LEVAQUIN Take 1 tablet (500 mg total) by mouth daily.   losartan 50 MG tablet Commonly known as:  COZAAR Take 50 mg by mouth daily.   predniSONE 10 MG (21) Tbpk tablet Commonly known as:  STERAPRED UNI-PAK 21 TAB Take 1 tablet (10 mg total) by mouth daily. Start 60 mg daily, taper 10 mg daily until done   tiotropium 18 MCG inhalation capsule Commonly known as:  SPIRIVA HANDIHALER Place 1 capsule  (18 mcg total) into inhaler and inhale daily.   Vitamin D3 1000 units Caps Take 1 capsule by mouth daily.       Review of Systems  Constitutional: Positive for appetite change and fatigue. Negative for activity change, chills, diaphoresis and fever.  HENT: Negative for congestion, sneezing, sore throat, trouble swallowing and voice change.   Respiratory: Positive for shortness of breath. Negative for apnea, cough, choking, chest tightness and wheezing.   Cardiovascular: Negative for chest pain, palpitations and leg swelling.  Musculoskeletal: Negative for back pain, gait problem and myalgias. Arthralgias: typical arthritis.  Psychiatric/Behavioral: Positive for agitation. Negative for behavioral problems.  All other systems reviewed and are negative.   There is no immunization history for the selected administration types on file for this patient. Pertinent  Health Maintenance Due  Topic Date Due  . PNA vac Low Risk Adult (1 of 2 - PCV13) 06/22/1999  . INFLUENZA VACCINE  10/20/2015   No flowsheet data found. Functional Status Survey:    Vitals:   03/30/16 0500  BP: (!) 152/84  Pulse: (!) 120  Resp: (!) 35  Temp: 97.2 F (36.2 C)  SpO2: (!) 89%  Weight: 101 lb (45.8 kg)   Body mass index is 15.82 kg/m. Physical Exam  Constitutional: He is oriented to person, place, and time. Vital signs are normal. He appears well-developed. He appears cachectic. He is active and uncooperative. He does not appear ill. No distress. Nasal cannula in place.  HENT:  Head: Normocephalic and atraumatic.  Mouth/Throat: Uvula is midline, oropharynx is clear and moist and mucous membranes are normal. Mucous membranes are not pale, not dry and not cyanotic.  Eyes: Conjunctivae, EOM and lids are normal. Pupils are equal, round, and reactive to light.  Neck: Trachea normal, normal range of motion and full passive range of motion without pain. Neck supple. No JVD present. No tracheal deviation, no  edema and no erythema present. No thyromegaly present.  Cardiovascular: Normal heart sounds, intact distal pulses and normal pulses.  An irregular rhythm present. Tachycardia present.  Exam reveals no gallop, no distant heart sounds and no friction rub.   No murmur heard. Pulmonary/Chest: Effort normal. No accessory muscle usage. Tachypnea noted. No respiratory distress. He has decreased breath sounds in the right lower field and the left lower field. He has no wheezes. He has no rhonchi. He has no rales. He exhibits no tenderness.  Abdominal: Normal appearance and bowel sounds are normal. He exhibits no distension and no ascites. There is no tenderness.  Musculoskeletal: Normal range of motion. He exhibits no edema or tenderness.  Expected osteoarthritis, stiffness  Neurological: He is alert and oriented to person, place, and time. He has normal strength.  Skin: Skin is warm, dry and intact. He is not diaphoretic. No cyanosis. No pallor. Nails show no clubbing.  Psychiatric: His speech is normal. His affect is blunt. He is agitated and withdrawn. Thought content is paranoid and delusional. Cognition and memory are normal. He expresses inappropriate judgment.  Nursing note and vitals reviewed.   Labs reviewed:  Recent Labs  03/26/16 1233 03/27/16 0456 03/27/16 1734 03/28/16 0446 03/29/16 0420  NA 139 141  --  144 145  K 2.5* 3.3*  3.3* 3.7 5.0 4.3  CL 95* 105  --  108 103  CO2 30 30  --  29 33*  GLUCOSE 137* 139*  --  135* 152*  BUN 19 14  --  16 19  CREATININE 0.96 0.80  --  0.79 0.73  CALCIUM 8.2* 7.8*  --  8.5* 9.1  MG 1.1* 1.9  --  1.7  --   PHOS 2.5 2.3*  --  2.9  --     Recent Labs  10/17/15 1132 03/27/16 0456  AST 18 29  ALT 10* 11*  ALKPHOS 113 80  BILITOT 0.7 0.3  PROT 9.5* 7.3  ALBUMIN 4.5 2.5*    Recent Labs  03/03/16 1406  03/27/16 0456 03/28/16 0446 03/29/16 0420  WBC 10.4  < > 15.3* 14.1* 12.1*  NEUTROABS 9.2*  --   --   --   --   HGB 9.6*  < >  8.4* 8.8* 9.0*  HCT 28.7*  < > 25.8* 26.9* 27.8*  MCV 93.5  < > 93.7 93.7 92.4  PLT 358  < > 335 321 336  < > = values in this interval not displayed. Lab Results  Component Value Date   TSH 0.644 03/26/2016   No results found for: HGBA1C No results found for: CHOL, HDL, LDLCALC, LDLDIRECT, TRIG, CHOLHDL  Significant Diagnostic Results in last 30 days:  Dg Chest 2 View  Result Date: 03/26/2016 CLINICAL DATA:  Weakness.  Smoker. EXAM: CHEST  2 VIEW COMPARISON:  October 02, 2014 FINDINGS: Hyperinflation of the lungs persists. Mild increased interstitial markings, unchanged, possibly due to smoking related change. No suspicious nodule or mass. No focal infiltrate. Minimal blunting of the left costophrenic angle is stable consistent with pleural thickening versus a tiny effusion. IMPRESSION: No acute interval change. Hyperinflation of the lungs consistent with emphysema again identified. Electronically Signed   By: Dorise Bullion III M.D   On: 03/26/2016 13:11    Assessment/Plan 1. Panlobular emphysema (HCC)  DC Spiriva  DC Advair  Schedule Duonebs TID  Schedule Pulmicort 2 grams TID  DC Hydrocodone  Morphine 20 mg/ ml 0.25-0.5 ml po Q 1 hour prn pain, dyspnea  Palliative Care Consult for declining health  2. Cigarette Smoker  Nicoderm patch 21 mg TD Q day  Family/ staff Communication:   Total Time:  Documentation:  Face to Face:  Family/Phone:   Labs/tests ordered:    Medication list reviewed and assessed for continued appropriateness.  Vikki Ports, NP-C Geriatrics [facility] [phone] [place], [place] [address] Cell Phone (Mon-Fri 8am-5pm):  [phone] On Call:  [phone] & follow prompts after 5pm & weekends Office Phone:  [phone] Office Fax:  [phone]

## 2016-04-08 NOTE — Telephone Encounter (Signed)
-----   Message from Hollice Espy, MD sent at 03/31/2016 12:55 PM EST ----- Regarding: Needs urology f/u for cystoscopy Can you please arrange for cystoscopy for this patient in Feb 2018?  I don't want him to get lost to follow up.    Hollice Espy, MD  ----- Message ----- From: Lequita Asal, MD Sent: 01/09/2016   3:49 PM To: Hollice Espy, MD

## 2016-04-08 NOTE — Telephone Encounter (Signed)
So  I called and spoke with the patient's daughter Lelan Pons and she told me that they have had to call Hospice in yesterday for him. She said that they don't expect him to live much longer. She said that he finished his treatment the end of December. I told her that if she thought about it to keep [place] posted on him and that we were thinking of them.  Sharyn Lull

## 2016-04-18 ENCOUNTER — Ambulatory Visit: Payer: Medicare Other | Attending: Radiation Oncology | Admitting: Radiation Oncology

## 2016-04-21 ENCOUNTER — Encounter: Admission: RE | Admit: 2016-04-21 | Payer: Medicare Other | Source: Ambulatory Visit | Admitting: Internal Medicine

## 2016-04-21 DEATH — deceased

## 2017-07-05 IMAGING — CT CT CHEST W/ CM
1 series · 13 of 34 positions shown, 17 images · IV contrast (iopamidol)
Comparison: Chest CT 10/08/2014.

CLINICAL DATA: 83-year-old male with history of bladder cancer
status post resection in October 2015. Currently asymptomatic.

EXAM:
CT CHEST WITH CONTRAST
TECHNIQUE: Multidetector CT imaging of the chest was performed during
intravenous contrast administration.
CONTRAST:  75mL [id number] IOPAMIDOL ([id number]) INJECTION 61%

[Series 2: axial st · axial · 0.64mm/px · z∈[-730,-420]mm · 13 of 183 slices shown, 17 images]
[im 14/183  mediastinal]
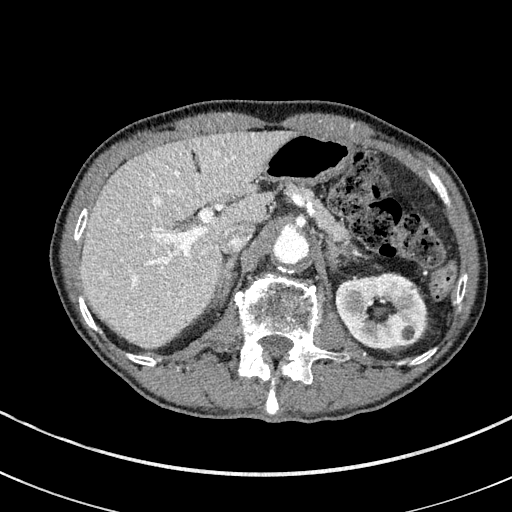
[im 14/183  lung]
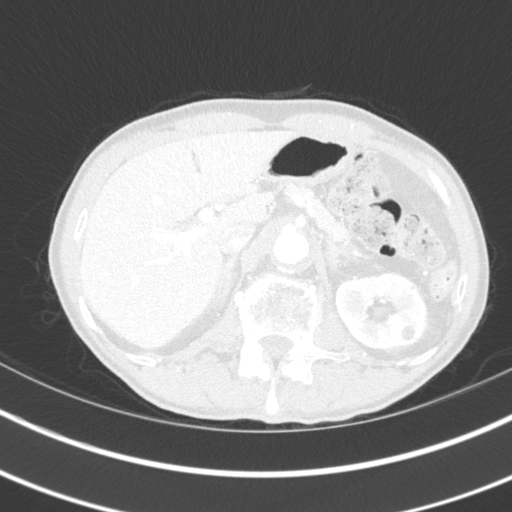
[im 27/183  lung]
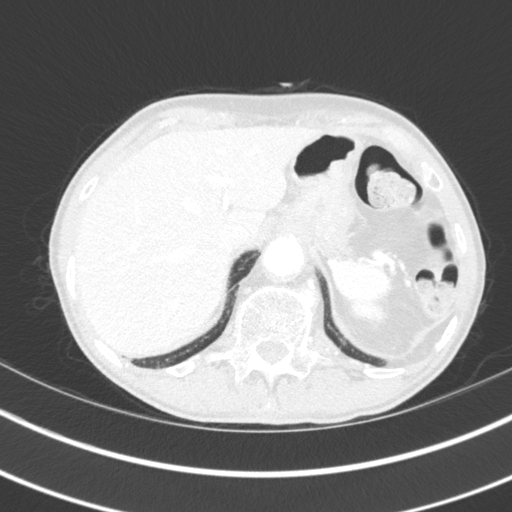
[im 41/183  lung]
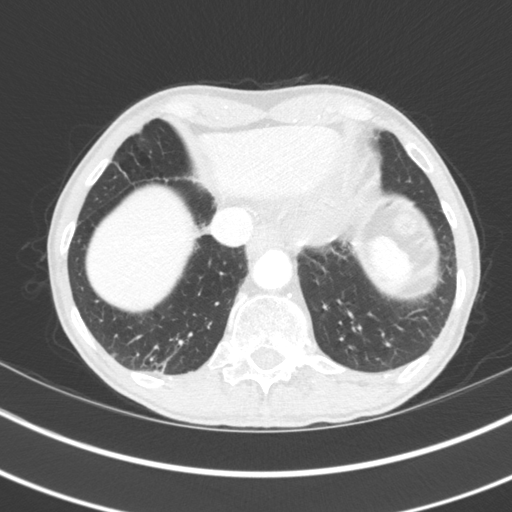
[im 54/183  lung]
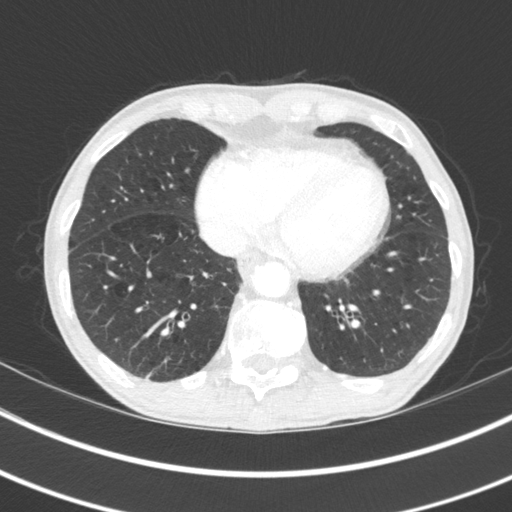
[im 73/183  mediastinal]
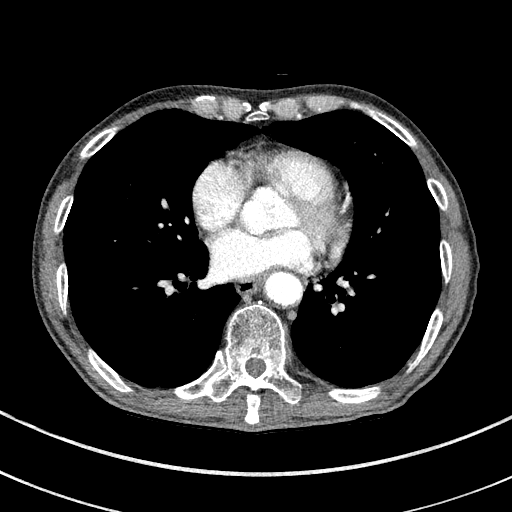
[im 73/183  lung]
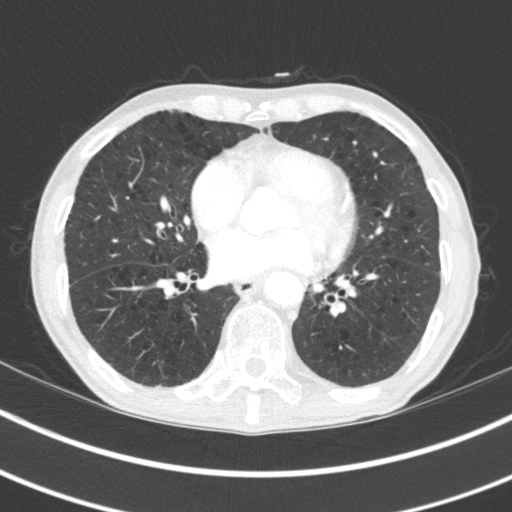
[im 81/183  lung]
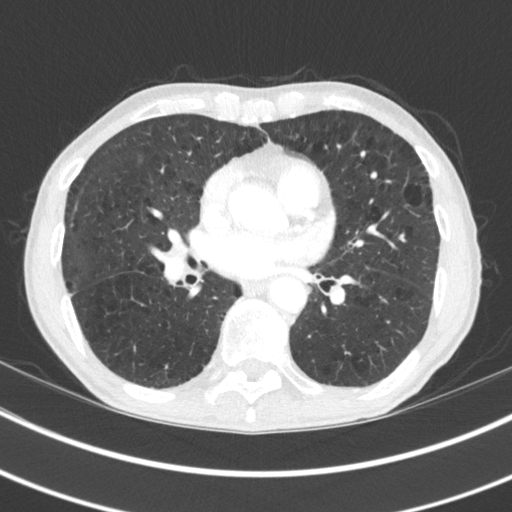
[im 95/183  lung]
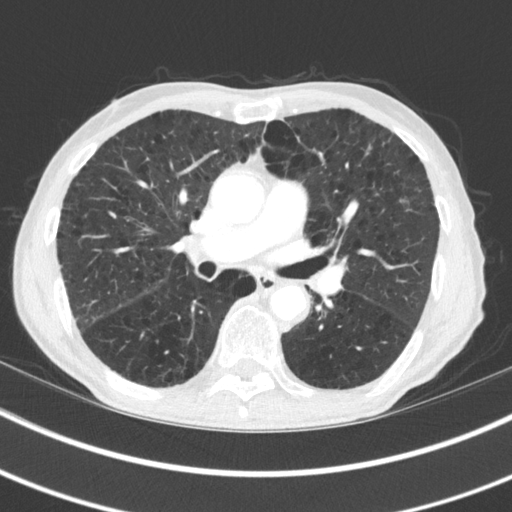
[im 102/183  lung]
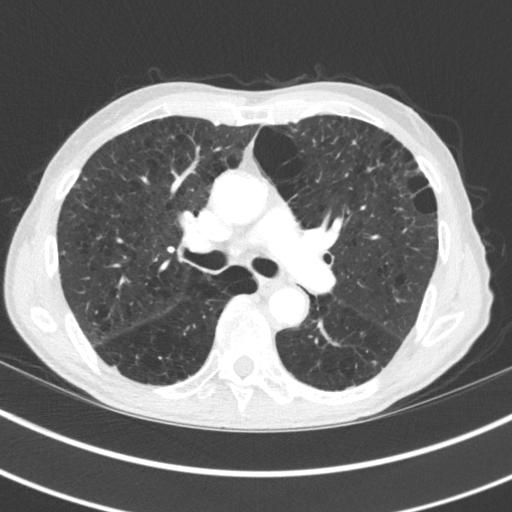
[im 110/183  mediastinal]
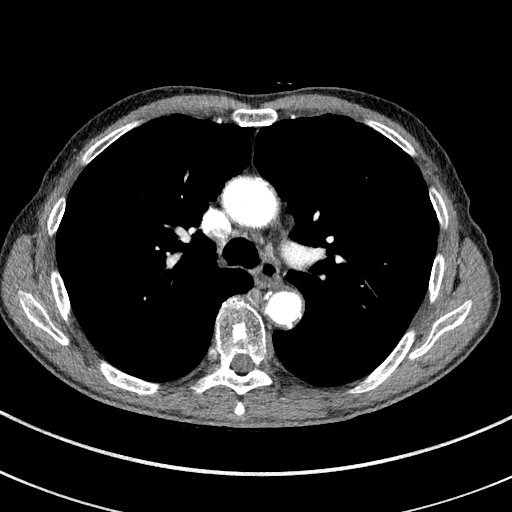
[im 110/183  lung]
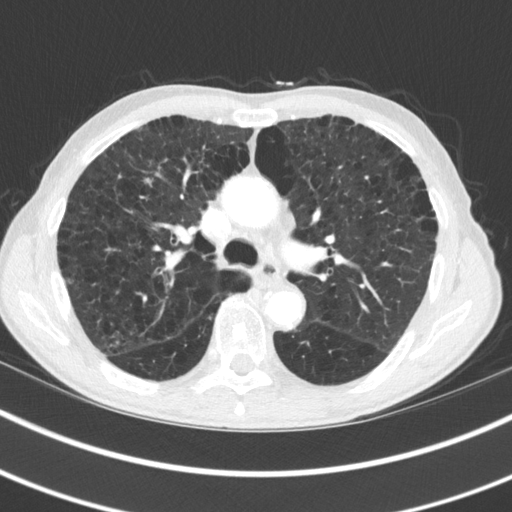
[im 129/183  lung]
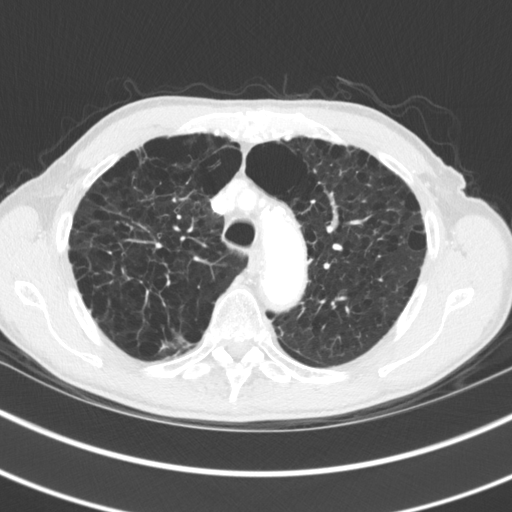
[im 142/183  lung]
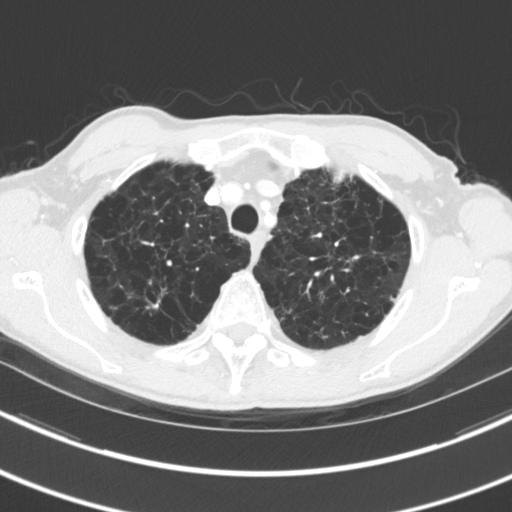
[im 156/183  lung]
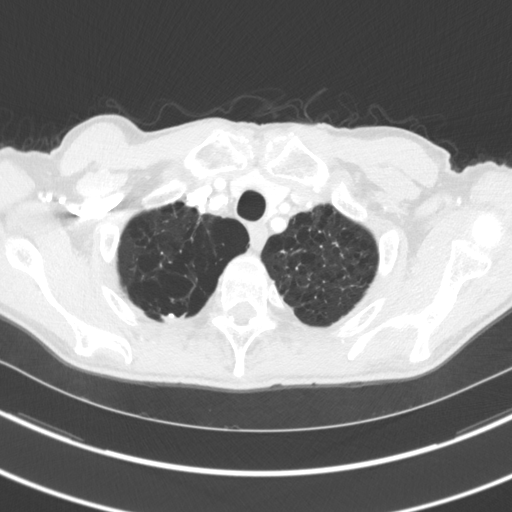
[im 169/183  mediastinal]
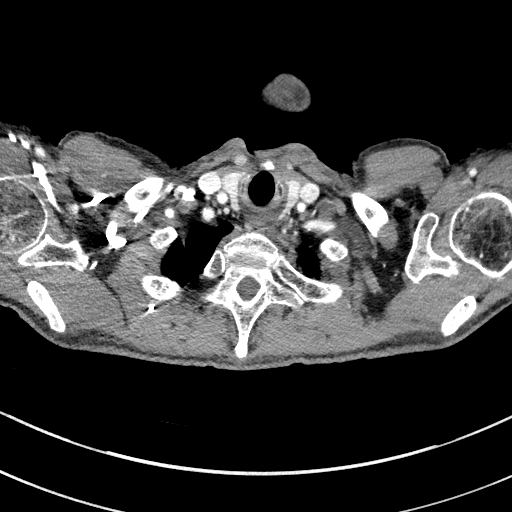
[im 169/183  lung]
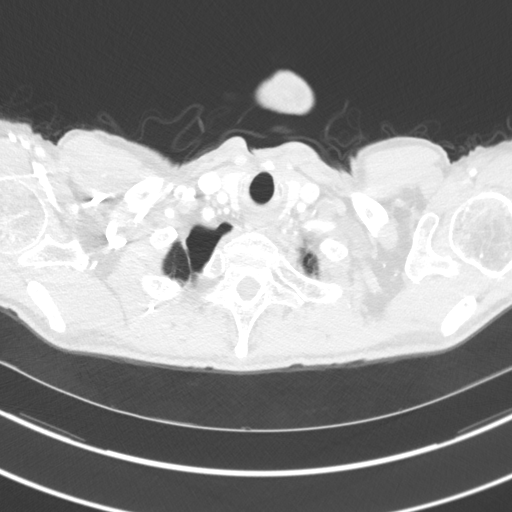

[13 of 34 positions shown; findings below may reference images not displayed]

FINDINGS: Cardiovascular: Heart size is normal. There is no significant
pericardial fluid, thickening or pericardial calcification. There is
aortic atherosclerosis, as well as atherosclerosis of the great
vessels of the mediastinum and the coronary arteries, including
calcified atherosclerotic plaque in the left main, left anterior
descending and right coronary arteries. In addition, there is
moderate stenosis in the proximal left subclavian artery (axial
image 46 of series 2) which appears greater than 50% diameter
stenosis.

Mediastinum/Nodes: No pathologically enlarged mediastinal or hilar
lymph nodes. Esophagus is unremarkable in appearance. No axillary
lymphadenopathy.

Lungs/Pleura: There multiple tiny pulmonary nodules scattered
throughout the lungs bilaterally, the majority of which are
subpleural in location, or appear to be associated with some dilated
bronchials, likely to represent benign lesions such as subpleural
lymph nodes and/or areas of mucoid impaction. The largest lesion on
today's study is a sub solid lesion measuring 16 x 12 mm (mean
diameter of 14 mm) in the posterior aspect of the right upper lobe
(image 57 of series 3) abutting the major fissure, with surrounding
architectural distortion and an internal air bronchograms, with
internal solid components and measure up to only 3 mm (axial image
57 of series 2). Diffuse bronchial wall thickening with moderate
centrilobular and paraseptal emphysema, most severe in the lung
apices. No acute consolidative airspace disease. No pleural
effusions.

Upper Abdomen: 1.5 cm low-attenuation lesion in the upper pole the
left kidney is compatible with a simple cyst. Additional sub cm
low-attenuation lesions in the visualized kidneys bilaterally are
too small to characterize, but are favored to represent tiny cysts.
Extensive aortic atherosclerosis.

Musculoskeletal: There are no aggressive appearing lytic or blastic
lesions noted in the visualized portions of the skeleton.
IMPRESSION: 1. While there are multiple new pulmonary nodules scattered
throughout the lungs bilaterally, the majority of these are favored
to represent benign disease, likely subpleural lymph nodes and areas
of mucoid impaction. There is one larger sub solid nodule in the
posterior aspect of the right upper lobe (image 57 of series 3)
which has a ground-glass attenuation component measuring
approximately 1.6 x 1.2 cm, and a small solid component measuring
only 3 mm, with some internal air bronchograms. This could simply
represent an area of post infectious or inflammatory scarring,
however, the possibility of the indolent neoplasm such as a primary
bronchogenic adenocarcinoma should be considered. Follow-up
non-contrast CT recommended at 3-6 months to confirm persistence. If
unchanged, and solid component remains <6 mm, annual CT is
recommended until 5 years of stability has been established. If
persistent these nodules should be considered highly suspicious if
the solid component of the nodule is 6 mm or greater in size and
enlarging. This recommendation follows the consensus statement:
Guidelines for Management of Incidental Pulmonary Nodules Detected
[DATE].
2. Mild diffuse bronchial wall thickening with moderate
centrilobular and paraseptal emphysema; imaging findings suggestive
of underlying COPD.
3. Aortic atherosclerosis, in addition to left main and 2 vessel
coronary artery disease. There is also moderate stenosis in the
proximal left subclavian artery.

## 2017-09-02 IMAGING — CR DG LUMBAR SPINE 2-3V
1 series · 3 of 3 positions shown · non-contrast
Comparison: CT abdomen pelvis of 11/11/2015

CLINICAL DATA: Right hip pain, currently undergoing treatment for
bladder carcinoma

EXAM:
LUMBAR SPINE - 2-3 VIEW

[Series 1: dg lumbar spine 2-3 views · 0.14mm/px · 3 of 3 slices shown]
[im 1/3]
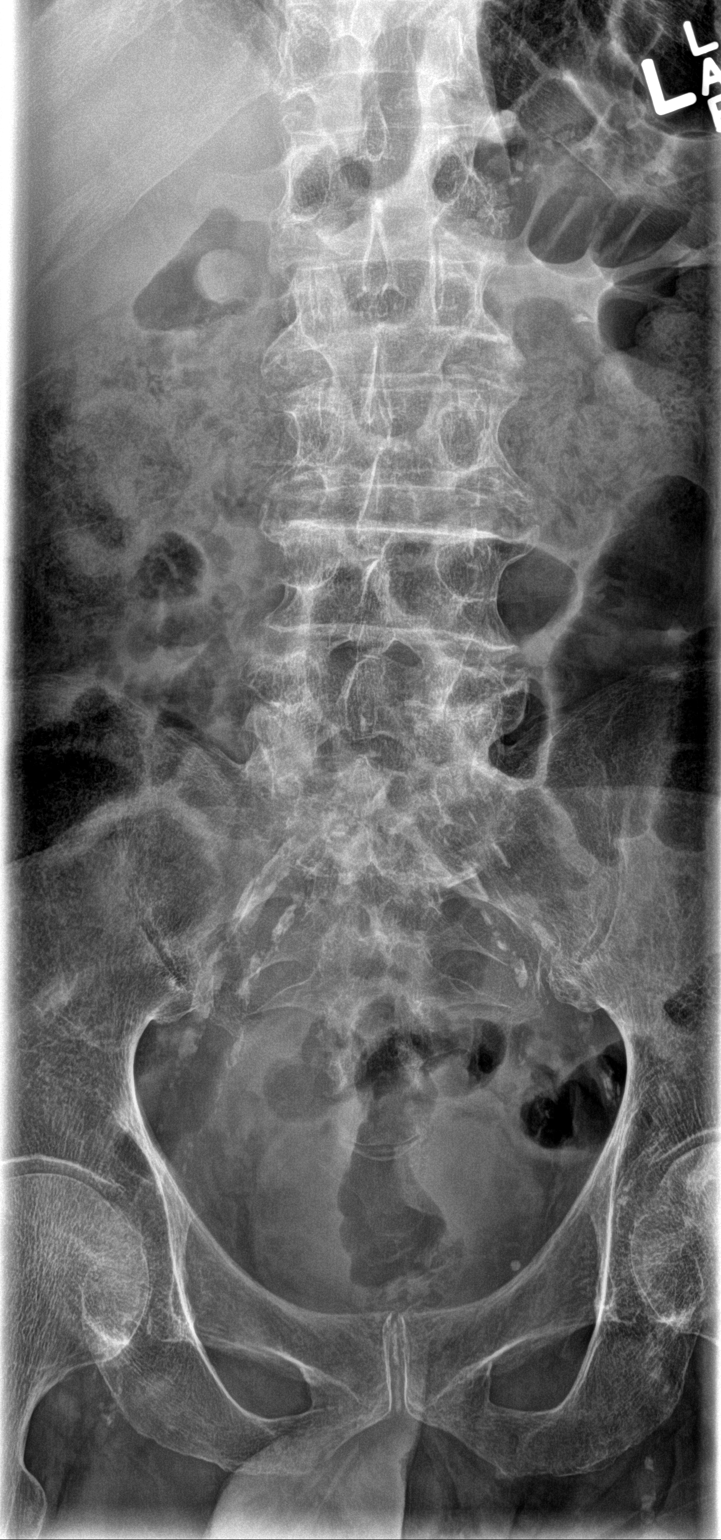
[im 2/3]
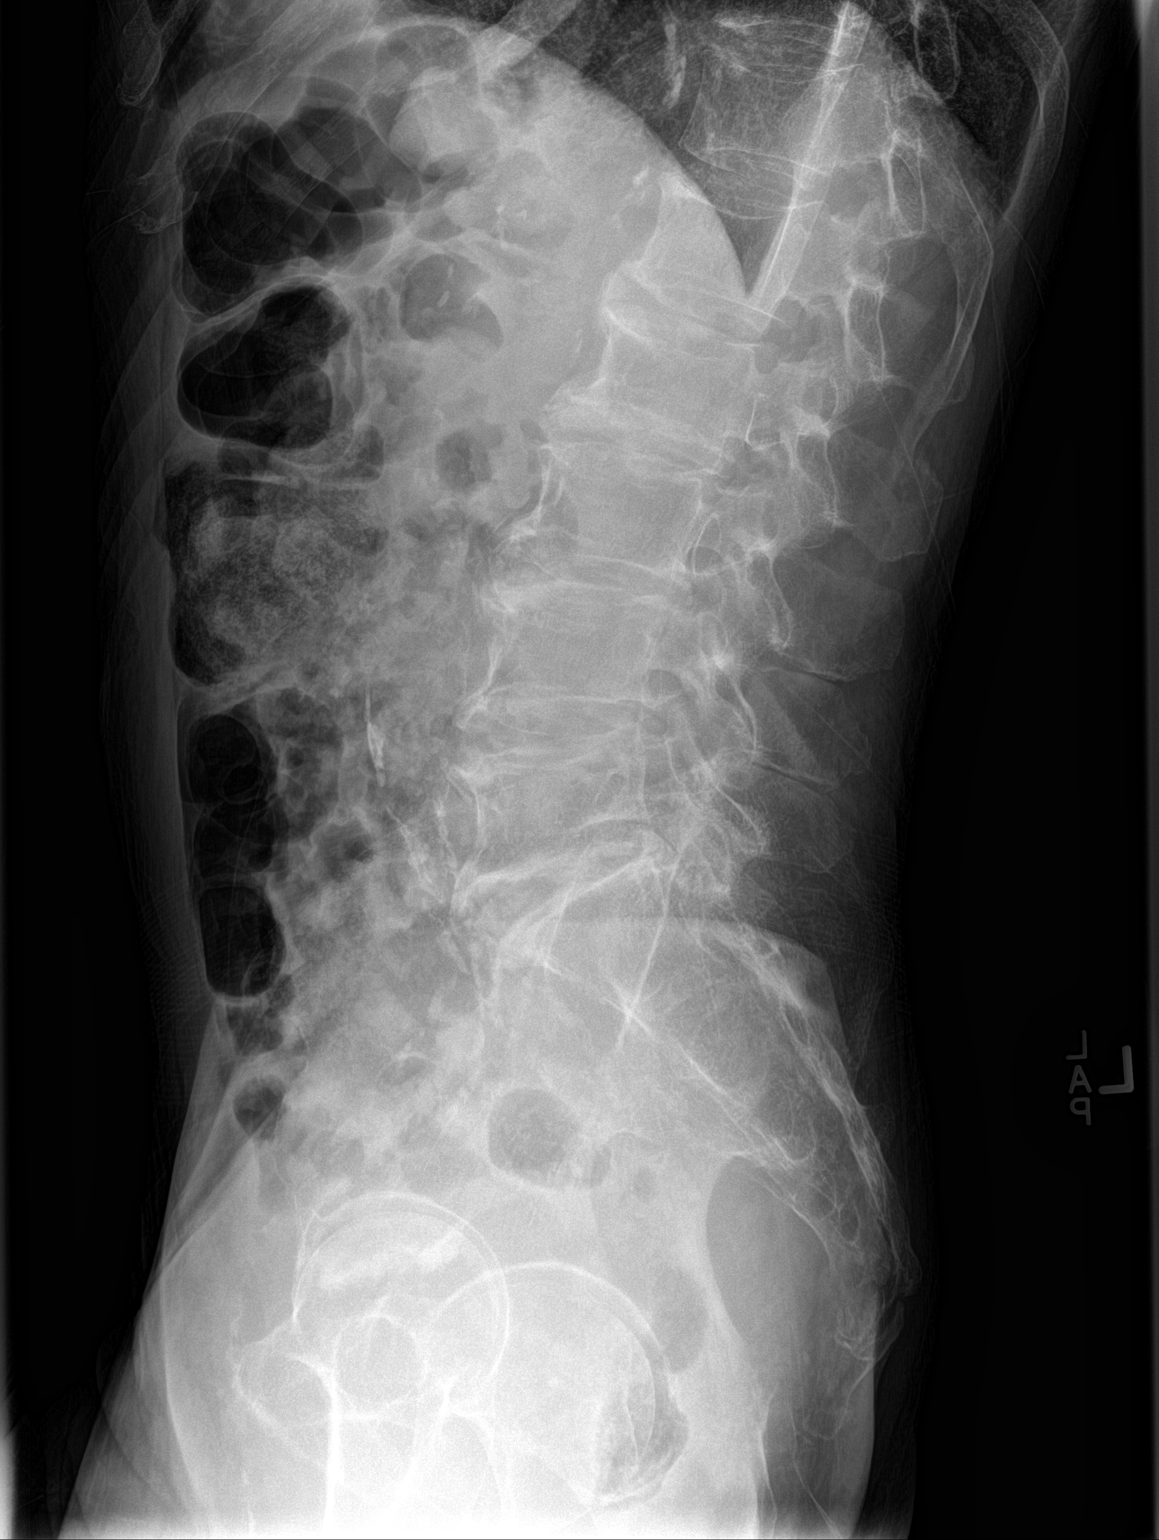
[im 3/3]
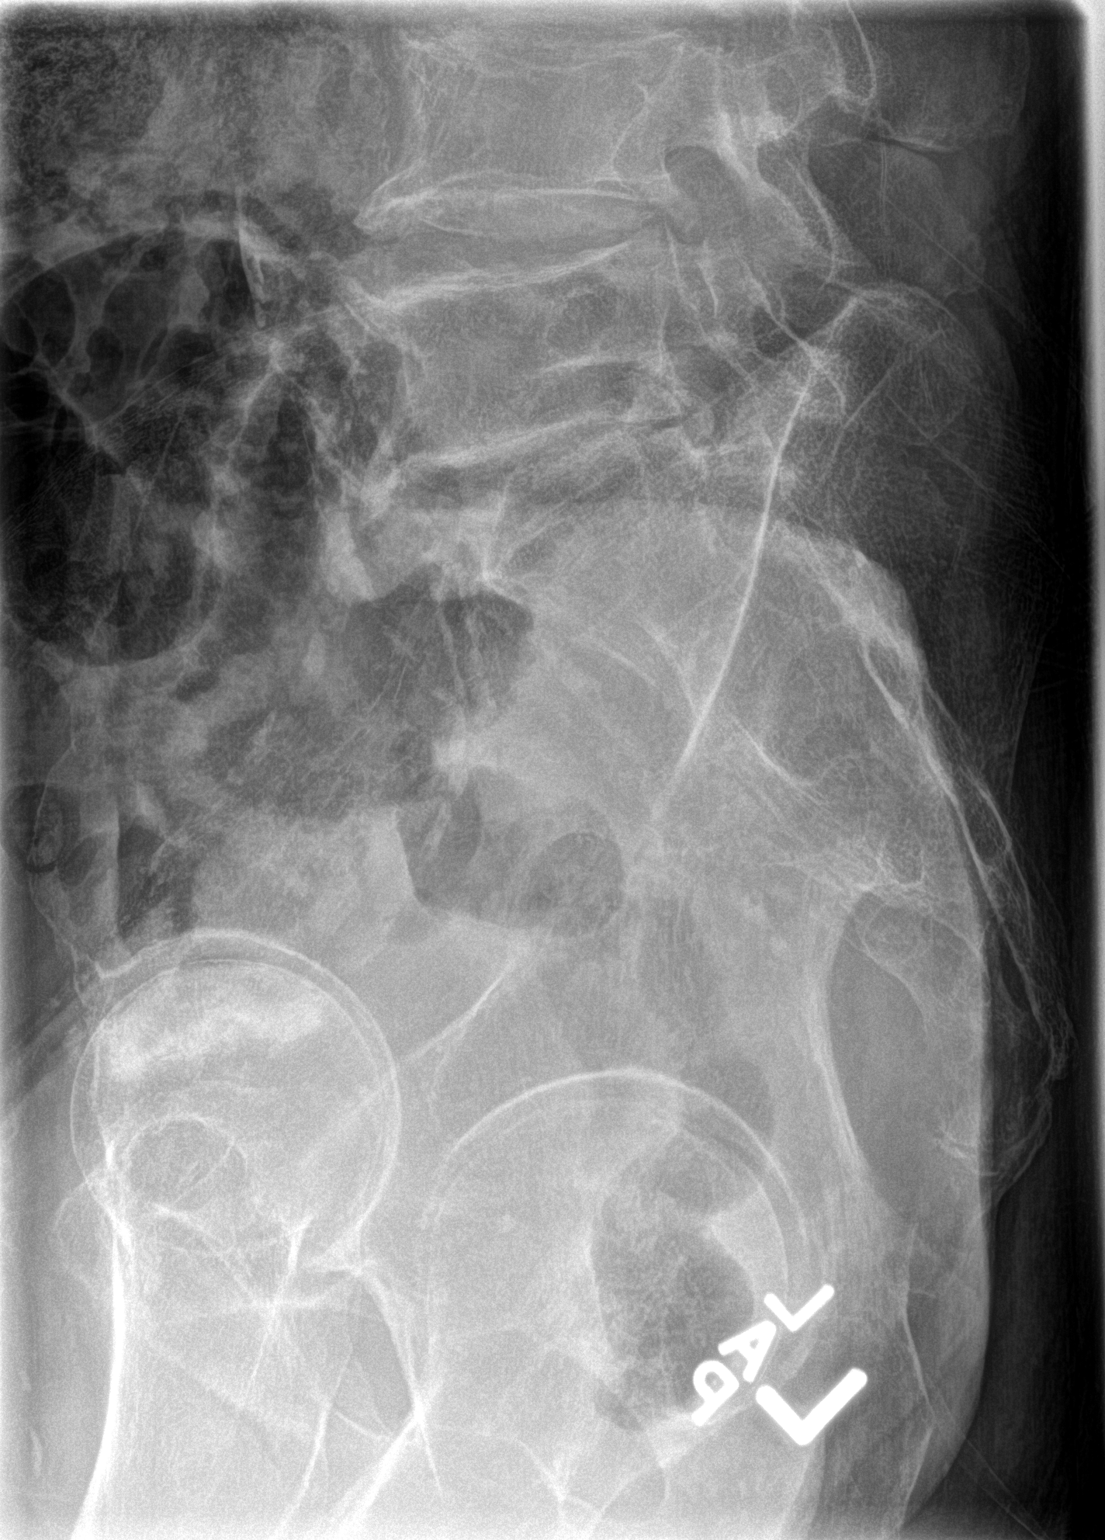

[3 of 3 positions shown; findings below may reference images not displayed]

FINDINGS: The lumbar vertebrae remain in normal alignment. There are diffuse
degenerative changes with osteophytes noted. Intervertebral disc
spaces are relatively well preserved for age. No compression
deformity is seen. The bones are diffusely osteopenic.
IMPRESSION: 1. Normal alignment with diffuse degenerative change as noted
previously. No acute compression deformity.
2. Osteopenia.
